# Patient Record
Sex: Female | Born: 1965 | Race: White | Hispanic: No | Marital: Married | Smoking: Never smoker
Health system: Southern US, Community
[De-identification: ages and names within clinical notes are randomized; demographics above are authoritative.]

## PROBLEM LIST (undated history)

## (undated) DIAGNOSIS — T8859XA Other complications of anesthesia, initial encounter: Secondary | ICD-10-CM

## (undated) DIAGNOSIS — I10 Essential (primary) hypertension: Secondary | ICD-10-CM

## (undated) DIAGNOSIS — K219 Gastro-esophageal reflux disease without esophagitis: Secondary | ICD-10-CM

## (undated) DIAGNOSIS — T4145XA Adverse effect of unspecified anesthetic, initial encounter: Secondary | ICD-10-CM

## (undated) DIAGNOSIS — R7303 Prediabetes: Secondary | ICD-10-CM

## (undated) DIAGNOSIS — J45909 Unspecified asthma, uncomplicated: Secondary | ICD-10-CM

## (undated) DIAGNOSIS — J189 Pneumonia, unspecified organism: Secondary | ICD-10-CM

## (undated) DIAGNOSIS — M1990 Unspecified osteoarthritis, unspecified site: Secondary | ICD-10-CM

---

## 1993-06-28 HISTORY — PX: ABDOMINAL HYSTERECTOMY: SHX81

## 1998-06-28 HISTORY — PX: LYMPH NODE BIOPSY: SHX201

## 2017-08-10 ENCOUNTER — Other Ambulatory Visit (HOSPITAL_COMMUNITY): Payer: Self-pay | Admitting: General Surgery

## 2017-08-24 ENCOUNTER — Other Ambulatory Visit (HOSPITAL_COMMUNITY): Payer: Managed Care, Other (non HMO)

## 2017-08-24 ENCOUNTER — Ambulatory Visit (HOSPITAL_COMMUNITY): Payer: Managed Care, Other (non HMO)

## 2017-08-29 ENCOUNTER — Ambulatory Visit (HOSPITAL_COMMUNITY)
Admission: RE | Admit: 2017-08-29 | Discharge: 2017-08-29 | Disposition: A | Discharge status: Home / Self Care | Payer: Managed Care, Other (non HMO) | Source: Ambulatory Visit | Attending: General Surgery | Admitting: General Surgery

## 2017-08-29 ENCOUNTER — Other Ambulatory Visit: Payer: Self-pay

## 2017-08-29 ENCOUNTER — Encounter: Payer: Self-pay | Admitting: Skilled Nursing Facility1

## 2017-08-29 ENCOUNTER — Encounter: Payer: Managed Care, Other (non HMO) | Attending: General Surgery | Admitting: Skilled Nursing Facility1

## 2017-08-29 DIAGNOSIS — R9431 Abnormal electrocardiogram [ECG] [EKG]: Secondary | ICD-10-CM | POA: Diagnosis not present

## 2017-08-29 DIAGNOSIS — R932 Abnormal findings on diagnostic imaging of liver and biliary tract: Secondary | ICD-10-CM | POA: Diagnosis not present

## 2017-08-29 DIAGNOSIS — Z713 Dietary counseling and surveillance: Secondary | ICD-10-CM | POA: Diagnosis not present

## 2017-08-29 DIAGNOSIS — Z01810 Encounter for preprocedural cardiovascular examination: Secondary | ICD-10-CM | POA: Insufficient documentation

## 2017-08-29 DIAGNOSIS — E669 Obesity, unspecified: Secondary | ICD-10-CM | POA: Diagnosis present

## 2017-08-29 DIAGNOSIS — K449 Diaphragmatic hernia without obstruction or gangrene: Secondary | ICD-10-CM | POA: Insufficient documentation

## 2017-08-29 NOTE — Progress Notes (Signed)
Pre-Op Assessment Visit:  Pre-Operative RYGB Surgery  Medical Nutrition Therapy:  Appt start time: 1:57  End time:  2:57  Patient was seen on 08/29/2017 for Pre-Operative Nutrition Assessment. Assessment and [facility] on 08/29/2017.   Pt expectation of surgery: to lose 20 pounds in 1 month.   Pt states she feels apples make her hungry.   Start weight at [facility]: 273.3 BMI: 44.11  24 hr Dietary Recall: First Meal: 4 am: protein shakes or eggs---skipped usually Snack:  Second Meal: grilled chicken and broccoli and brussle sprouts squash zucchini Snack: yogurt covered pretzels  Third Meal: eating out: vegetables and protein Snack: dessert  Beverages: water, hot tea, sweet tea, coffee with creamer  Encouraged to engage in 150 minutes of moderate physical activity including cardiovascular and weight baring weekly  Handouts given during visit include:  . Pre-Op Goals . Bariatric Surgery Protein Shakes During the appointment today the following Pre-Op Goals were reviewed with the patient: . Maintain or lose weight as instructed by your surgeon . Make healthy food choices . Begin to limit portion sizes . Limited concentrated sugars and fried foods . Keep fat/sugar in the single digits per serving on             food labels . Practice CHEWING your food  (aim for 30 chews per bite or until applesauce consistency) . Practice not drinking 15 minutes before, during, and 30 minutes after each meal/snack . Avoid all carbonated beverages  . Avoid/limit caffeinated beverages  . Avoid all sugar-sweetened beverages . Consume 3 meals per day; eat every 3-5 hours . Make a list of non-food related activities . Aim for 64-100 ounces of FLUID daily  . Aim for at least 60-80 grams of PROTEIN daily . Look for a liquid protein source that contain ?15 g protein and ?5 g carbohydrate  (ex: shakes, drinks, shots)  -Follow  diet recommendations listed below   Energy and Macronutrient Recomendations: Calories: 1500 Carbohydrate: 170 Protein: 112 Fat: 42  Demonstrated degree of understanding via:  Teach Back  Teaching Method Utilized:  Visual Auditory Hands on  Barriers to learning/adherence to lifestyle change: none identified   Patient to call the [facility] to enroll in Pre-Op and Post-Op Nutrition Education when surgery date is scheduled.

## 2017-10-17 ENCOUNTER — Ambulatory Visit: Payer: Self-pay | Admitting: Registered"

## 2017-10-18 ENCOUNTER — Ambulatory Visit (INDEPENDENT_AMBULATORY_CARE_PROVIDER_SITE_OTHER): Payer: 59 | Admitting: Psychiatry

## 2017-10-18 DIAGNOSIS — F509 Eating disorder, unspecified: Secondary | ICD-10-CM

## 2017-10-24 ENCOUNTER — Encounter: Payer: Managed Care, Other (non HMO) | Attending: General Surgery | Admitting: Skilled Nursing Facility1

## 2017-10-24 DIAGNOSIS — E669 Obesity, unspecified: Secondary | ICD-10-CM | POA: Insufficient documentation

## 2017-10-24 DIAGNOSIS — Z713 Dietary counseling and surveillance: Secondary | ICD-10-CM | POA: Insufficient documentation

## 2017-10-26 ENCOUNTER — Encounter: Payer: Self-pay | Admitting: Skilled Nursing Facility1

## 2017-10-26 NOTE — Progress Notes (Signed)
Pre-Operative Nutrition Class:  Appt start time: 1443   End time:  1830.  Patient was seen on 10/24/2017 for Pre-Operative Bariatric Surgery Education at [facility].   Surgery date:  Surgery type: RYGB Start weight at [facility]: 273.3 Weight today: 276.5  Samples given per MNT protocol. Patient educated on appropriate usage: Procare Multivitamin Lot #  [id number] Exp: 12/2018  Renee Pain Protein  Shake Lot # [id number] Exp: 07/march/2020  The following the learning objectives were met by the patient during this course:  Identify Pre-Op Dietary Goals and will begin 2 weeks pre-operatively  Identify appropriate sources of fluids and proteins   State protein recommendations and appropriate sources pre and post-operatively  Identify Post-Operative Dietary Goals and will follow for 2 weeks post-operatively  Identify appropriate multivitamin and calcium sources  Describe the need for physical activity post-operatively and will follow MD recommendations  State when to call healthcare provider regarding medication questions or post-operative complications  Handouts given during class include:  Pre-Op Bariatric Surgery Diet Handout  Protein Shake Handout  Post-Op Bariatric Surgery Nutrition Handout  BELT Program Information Flyer  Support Group Information Flyer  [facility] Bariatric Supplements Price List  Follow-Up Plan: Patient will follow-up at [facility] 2 weeks post operatively for diet advancement per MD.

## 2017-11-01 ENCOUNTER — Ambulatory Visit (INDEPENDENT_AMBULATORY_CARE_PROVIDER_SITE_OTHER): Payer: 59 | Admitting: Psychiatry

## 2017-11-01 DIAGNOSIS — F509 Eating disorder, unspecified: Secondary | ICD-10-CM | POA: Diagnosis not present

## 2017-12-02 ENCOUNTER — Ambulatory Visit: Payer: Self-pay | Admitting: General Surgery

## 2017-12-14 NOTE — Patient Instructions (Addendum)
Leonette Tischer  12/14/2017   Your procedure is scheduled on: 12-19-17  Report to [facility] Main  Entrance  Report to admitting at 1130 AM    Call this number if you have problems the morning of surgery [phone]   Remember: Do not eat food after 600 pm night before surgery.MORNING OF SURGERY DRINK:  1SHAKE BEFORE YOU LEAVE HOME, DRINK ALL OF THE SHAKE AT ONE TIME.   NO SOLID FOOD AFTER 600 PM THE NIGHT BEFORE YOUR SURGERY. YOU MAY DRINK CLEAR FLUIDS. DRINK 2 SHAKES AT 1000 PM NIGHT BEFORE SURGERY.  THE SHAKE YOU DRINK BEFORE YOU LEAVE HOME WILL BE THE LAST FLUIDS YOU DRINK BEFORE SURGERY.DRINK LAST SHAKE AT 1030 AM.  PAIN IS EXPECTED AFTER SURGERY AND WILL NOT BE COMPLETELY ELIMINATED. AMBULATION AND TYLENOL WILL HELP REDUCE INCISIONAL AND GAS PAIN. MOVEMENT IS KEY!  YOU ARE EXPECTED TO BE OUT OF BED WITHIN 4 HOURS OF ADMISSION TO YOUR PATIENT ROOM.  SITTING IN THE RECLINER THROUGHOUT THE DAY IS IMPORTANT FOR DRINKING FLUIDS AND MOVING GAS THROUGHOUT THE GI TRACT.  COMPRESSION STOCKINGS SHOULD BE WORN [employer] STAY UNLESS YOU ARE WALKING.   INCENTIVE SPIROMETER SHOULD BE USED EVERY HOUR WHILE AWAKE TO DECREASE POST-OPERATIVE COMPLICATIONS SUCH AS PNEUMONIA.  WHEN DISCHARGED HOME, IT IS IMPORTANT TO CONTINUE TO WALK EVERY HOUR AND USE THE INCENTIVE SPIROMETER EVERY HOUR.     CLEAR LIQUID DIET   Foods Allowed                                                                     Foods Excluded  Coffee and tea, regular and decaf                             liquids that you cannot  Plain Jell-O in any flavor                                             see through such as: Fruit ices (not with fruit pulp)                                     milk, soups, orange juice  Iced Popsicles                                    All solid food Carbonated beverages, regular and diet                                    Cranberry, grape and apple juices Sports  drinks like Gatorade Lightly seasoned clear broth or consume(fat free) Sugar, honey syrup  Sample Menu Breakfast  Lunch                                     Supper Cranberry juice                    Beef broth                            Chicken broth Jell-O                                     Grape juice                           Apple juice Coffee or tea                        Jell-O                                      Popsicle                                                Coffee or tea                        Coffee or tea  _____________________________________________________________________        .    Take these medicines the morning of surgery with A SIP OF WATER: FLONASE NASAL SPRAY, NEXIUM              You may not have any metal on your body including hair pins and              piercings  Do not wear jewelry, make-up, lotions, powders or perfumes, deodorant             Do not wear nail polish.  Do not shave  48 hours prior to surgery.              Men may shave face and neck.   Do not bring valuables to the hospital. [place] [facility] IS NOT             RESPONSIBLE   FOR VALUABLES.  Contacts, dentures or bridgework may not be worn into surgery.  Leave suitcase in the car. After surgery it may be brought to your room.     Patients discharged the day of surgery will not be allowed to drive home.  Name and phone number of your driver:  Special Instructions: N/A              Please read over the following fact sheets you were given: _____________________________________________________________________ [facility] - Preparing for Surgery Before surgery, you can play an important role.  Because skin is not sterile, your skin needs to be as free of germs as possible.  You can reduce the number of germs on your skin by washing with CHG (chlorahexidine gluconate) soap before surgery.  CHG is an antiseptic cleaner which kills germs and bonds with the skin  to  continue killing germs even after washing. Please DO NOT use if you have an allergy to CHG or antibacterial soaps.  If your skin becomes reddened/irritated stop using the CHG and inform your nurse when you arrive at Short Stay. Do not shave (including legs and underarms) for at least 48 hours prior to the first CHG shower.  You may shave your face/neck. Please follow these instructions carefully:  1.  Shower with CHG Soap the night before surgery and the  morning of Surgery.  2.  If you choose to wash your hair, wash your hair first as usual with your  normal  shampoo.  3.  After you shampoo, rinse your hair and body thoroughly to remove the  shampoo.                           4.  Use CHG as you would any other liquid soap.  You can apply chg directly  to the skin and wash                       Gently with a scrungie or clean washcloth.  5.  Apply the CHG Soap to your body ONLY FROM THE NECK DOWN.   Do not use on face/ open                           Wound or open sores. Avoid contact with eyes, ears mouth and genitals (private parts).                       Wash face,  Genitals (private parts) with your normal soap.             6.  Wash thoroughly, paying special attention to the area where your surgery  will be performed.  7.  Thoroughly rinse your body with warm water from the neck down.  8.  DO NOT shower/wash with your normal soap after using and rinsing off  the CHG Soap.                9.  Pat yourself dry with a clean towel.            10.  Wear clean pajamas.            11.  Place clean sheets on your bed the night of your first shower and do not  sleep with pets. Day of Surgery : Do not apply any lotions/deodorants the morning of surgery.  Please wear clean clothes to the hospital/surgery center.  FAILURE TO FOLLOW THESE INSTRUCTIONS MAY RESULT IN THE CANCELLATION OF YOUR SURGERY PATIENT SIGNATURE_________________________________  NURSE  SIGNATURE__________________________________  ________________________________________________________________________   Adam Phenix  An incentive spirometer is a tool that can help keep your lungs clear and active. This tool measures how well you are filling your lungs with each breath. Taking long deep breaths may help reverse or decrease the chance of developing breathing (pulmonary) problems (especially infection) following:  A long period of time when you are unable to move or be active. BEFORE THE PROCEDURE   If the spirometer includes an indicator to show your best effort, your nurse or respiratory therapist will set it to a desired goal.  If possible, sit up straight or lean slightly forward. Try not to slouch.  Hold the incentive spirometer in an upright position. INSTRUCTIONS FOR USE  1. Sit  on the edge of your bed if possible, or sit up as far as you can in bed or on a chair. 2. Hold the incentive spirometer in an upright position. 3. Breathe out normally. 4. Place the mouthpiece in your mouth and seal your lips tightly around it. 5. Breathe in slowly and as deeply as possible, raising the piston or the ball toward the top of the column. 6. Hold your breath for 3-5 seconds or for as long as possible. Allow the piston or ball to fall to the bottom of the column. 7. Remove the mouthpiece from your mouth and breathe out normally. 8. Rest for a few seconds and repeat Steps 1 through 7 at least 10 times every 1-2 hours when you are awake. Take your time and take a few normal breaths between deep breaths. 9. The spirometer may include an indicator to show your best effort. Use the indicator as a goal to work toward during each repetition. 10. After each set of 10 deep breaths, practice coughing to be sure your lungs are clear. If you have an incision (the cut made at the time of surgery), support your incision when coughing by placing a pillow or rolled up towels firmly  against it. Once you are able to get out of bed, walk around indoors and cough well. You may stop using the incentive spirometer when instructed by your caregiver.  RISKS AND COMPLICATIONS  Take your time so you do not get dizzy or light-headed.  If you are in pain, you may need to take or ask for pain medication before doing incentive spirometry. It is harder to take a deep breath if you are having pain. AFTER USE  Rest and breathe slowly and easily.  It can be helpful to keep track of a log of your progress. Your caregiver can provide you with a simple table to help with this. If you are using the spirometer at home, follow these instructions: SEEK MEDICAL CARE IF:   You are having difficultly using the spirometer.  You have trouble using the spirometer as often as instructed.  Your pain medication is not giving enough relief while using the spirometer.  You develop fever of 100.5 F (38.1 C) or higher. SEEK IMMEDIATE MEDICAL CARE IF:   You cough up bloody sputum that had not been present before.  You develop fever of 102 F (38.9 C) or greater.  You develop worsening pain at or near the incision site. MAKE SURE YOU:   Understand these instructions.  Will watch your condition.  Will get help right away if you are not doing well or get worse. Document Released: 10/25/2006 Document Revised: 09/06/2011 Document Reviewed: 12/26/2006 ExitCare Patient Information 2014 [employer], [place].   ________________________________________________________________________  WHAT IS A BLOOD TRANSFUSION? Blood Transfusion Information  A transfusion is the replacement of blood or some of its parts. Blood is made up of multiple cells which provide different functions.  Red blood cells carry oxygen and are used for blood loss replacement.  White blood cells fight against infection.  Platelets control bleeding.  Plasma helps clot blood.  Other blood products are available for  specialized needs, such as hemophilia or other clotting disorders. BEFORE THE TRANSFUSION  Who gives blood for transfusions?   Healthy volunteers who are fully evaluated to make sure their blood is safe. This is blood bank blood. Transfusion therapy is the safest it has ever been in the practice of medicine. Before blood is taken from a donor, a  complete history is taken to make sure that person has no history of diseases nor engages in risky social behavior (examples are intravenous drug use or sexual activity with multiple partners). The donor's travel history is screened to minimize risk of transmitting infections, such as malaria. The donated blood is tested for signs of infectious diseases, such as HIV and hepatitis. The blood is then tested to be sure it is compatible with you in order to minimize the chance of a transfusion reaction. If you or a relative donates blood, this is often done in anticipation of surgery and is not appropriate for emergency situations. It takes many days to process the donated blood. RISKS AND COMPLICATIONS Although transfusion therapy is very safe and saves many lives, the main dangers of transfusion include:   Getting an infectious disease.  Developing a transfusion reaction. This is an allergic reaction to something in the blood you were given. Every precaution is taken to prevent this. The decision to have a blood transfusion has been considered carefully by your caregiver before blood is given. Blood is not given unless the benefits outweigh the risks. AFTER THE TRANSFUSION  Right after receiving a blood transfusion, you will usually feel much better and more energetic. This is especially true if your red blood cells have gotten low (anemic). The transfusion raises the level of the red blood cells which carry oxygen, and this usually causes an energy increase.  The nurse administering the transfusion will monitor you carefully for complications. HOME CARE  INSTRUCTIONS  No special instructions are needed after a transfusion. You may find your energy is better. Speak with your caregiver about any limitations on activity for underlying diseases you may have. SEEK MEDICAL CARE IF:   Your condition is not improving after your transfusion.  You develop redness or irritation at the intravenous (IV) site. SEEK IMMEDIATE MEDICAL CARE IF:  Any of the following symptoms occur over the next 12 hours:  Shaking chills.  You have a temperature by mouth above 102 F (38.9 C), not controlled by medicine.  Chest, back, or muscle pain.  People around you feel you are not acting correctly or are confused.  Shortness of breath or difficulty breathing.  Dizziness and fainting.  You get a rash or develop hives.  You have a decrease in urine output.  Your urine turns a dark color or changes to pink, red, or brown. Any of the following symptoms occur over the next 10 days:  You have a temperature by mouth above 102 F (38.9 C), not controlled by medicine.  Shortness of breath.  Weakness after normal activity.  The white part of the eye turns yellow (jaundice).  You have a decrease in the amount of urine or are urinating less often.  Your urine turns a dark color or changes to pink, red, or brown. Document Released: 06/11/2000 Document Revised: 09/06/2011 Document Reviewed: 01/29/2008 [facility] Patient Information 2014 [place], [place].  _______________________________________________________________________

## 2017-12-14 NOTE — Progress Notes (Signed)
EKG AND CHEST XRAY DONE 08-29-17 EPIC

## 2017-12-15 ENCOUNTER — Encounter (HOSPITAL_COMMUNITY)
Admission: RE | Admit: 2017-12-15 | Discharge: 2017-12-15 | Disposition: A | Discharge status: Home / Self Care | Payer: Managed Care, Other (non HMO) | Source: Ambulatory Visit | Attending: General Surgery | Admitting: General Surgery

## 2017-12-15 ENCOUNTER — Encounter (HOSPITAL_COMMUNITY): Payer: Self-pay | Admitting: *Deleted

## 2017-12-15 ENCOUNTER — Other Ambulatory Visit: Payer: Self-pay

## 2017-12-15 DIAGNOSIS — Z01812 Encounter for preprocedural laboratory examination: Secondary | ICD-10-CM | POA: Diagnosis not present

## 2017-12-15 HISTORY — DX: Morbid (severe) obesity due to excess calories: E66.01

## 2017-12-15 HISTORY — DX: Unspecified asthma, uncomplicated: J45.909

## 2017-12-15 HISTORY — DX: Gastro-esophageal reflux disease without esophagitis: K21.9

## 2017-12-15 HISTORY — DX: Prediabetes: R73.03

## 2017-12-15 HISTORY — DX: Unspecified osteoarthritis, unspecified site: M19.90

## 2017-12-15 HISTORY — DX: Adverse effect of unspecified anesthetic, initial encounter: T41.45XA

## 2017-12-15 HISTORY — DX: Essential (primary) hypertension: I10

## 2017-12-15 HISTORY — DX: Pneumonia, unspecified organism: J18.9

## 2017-12-15 HISTORY — DX: Other complications of anesthesia, initial encounter: T88.59XA

## 2017-12-15 LAB — COMPREHENSIVE METABOLIC PANEL
ALT: 20 U/L (ref 14–54)
ANION GAP: 8 (ref 5–15)
AST: 20 U/L (ref 15–41)
Albumin: 3.7 g/dL (ref 3.5–5.0)
Alkaline Phosphatase: 107 U/L (ref 38–126)
BUN: 13 mg/dL (ref 6–20)
CHLORIDE: 104 mmol/L (ref 101–111)
CO2: 29 mmol/L (ref 22–32)
CREATININE: 0.57 mg/dL (ref 0.44–1.00)
Calcium: 9.2 mg/dL (ref 8.9–10.3)
Glucose, Bld: 125 mg/dL — ABNORMAL HIGH (ref 65–99)
POTASSIUM: 3.9 mmol/L (ref 3.5–5.1)
Sodium: 141 mmol/L (ref 135–145)
Total Bilirubin: 0.5 mg/dL (ref 0.3–1.2)
Total Protein: 7.4 g/dL (ref 6.5–8.1)

## 2017-12-15 LAB — CBC WITH DIFFERENTIAL/PLATELET
Basophils Absolute: 0.1 10*3/uL (ref 0.0–0.1)
Basophils Relative: 1 %
EOS PCT: 3 %
Eosinophils Absolute: 0.2 10*3/uL (ref 0.0–0.7)
HCT: 38.4 % (ref 36.0–46.0)
Hemoglobin: 12.2 g/dL (ref 12.0–15.0)
LYMPHS ABS: 3.3 10*3/uL (ref 0.7–4.0)
LYMPHS PCT: 38 %
MCH: 27.1 pg (ref 26.0–34.0)
MCHC: 31.8 g/dL (ref 30.0–36.0)
MCV: 85.3 fL (ref 78.0–100.0)
MONO ABS: 0.5 10*3/uL (ref 0.1–1.0)
Monocytes Relative: 6 %
Neutro Abs: 4.5 10*3/uL (ref 1.7–7.7)
Neutrophils Relative %: 52 %
PLATELETS: 281 10*3/uL (ref 150–400)
RBC: 4.5 MIL/uL (ref 3.87–5.11)
RDW: 14 % (ref 11.5–15.5)
WBC: 8.6 10*3/uL (ref 4.0–10.5)

## 2017-12-15 LAB — HEMOGLOBIN A1C
HEMOGLOBIN A1C: 6.1 % — AB (ref 4.8–5.6)
Mean Plasma Glucose: 128.37 mg/dL

## 2017-12-15 LAB — ABO/RH: ABO/RH(D): B POS

## 2017-12-16 NOTE — H&P (Signed)
History of Present Illness Misty Holloway(Misty Holloway T. Misty Bluestein MD; 11/29/2017 4:54 PM) The patient is a 52 year old female who presents with obesity. She returns for preoperative visit prior to planned laparoscopic Roux-en-Y gastric bypass.. The patient gives a history of progressive obesity since early adulthood despite multiple attempts at medical management. She has been through innumerable efforts at supervised and unsupervised weight loss including medications and has been able to lose up to 70 pounds with the [employer] diet but then has experienced progressive weight regain. Obesity has been affecting the patient in a number of ways including difficulty with routine daily activities and joint pain and fatigue. She has remained relatively free of significant comorbidities although he is being followed for elevated cholesterol. She has a history of obesity and obesity related illness and her family and is very concerned about her long-term health forward at her current weight. The patient has been to our initial information seminar, researched surgical options thoroughly and after initial consultation we elective gastric bypass due to her significant GERD. She has successfully completed her preoperative workup. No concerns also like a nutritional evaluation. Upper GI series showed a small hiatal hernia. Lab work was unremarkable. She has been feeling well with no new illnesses or recent infections or hospitalizations.    Problem List/Past Medical Misty Holloway(Misty Holloway T. 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, MD; 11/29/2017 4:54 PM) Cesarean Section - 1  Hysterectomy (due to cancer) - Complete  Oral Surgery   Diagnostic Studies History Misty Holloway(Misty Holloway T. Misty Madia, MD; 11/29/2017 4:54 PM) Colonoscopy  never Mammogram  within last year Pap Smear  >5 years ago  Allergies Misty Holloway(Misty Holloway, RMA; 11/29/2017 4:35  PM) Morphine Sulfate ER *ANALGESICS - OPIOID*  Allergies Reconciled   Medication History Misty Holloway(Misty Holloway, RMA; 11/29/2017 4:35 PM) HydroCHLOROthiazide (12.5MG  Tablet, Oral) Active. NexIUM (Oral) Specific strength unknown - Active. Fluticasone Propionate (50MCG/ACT Suspension, Nasal) Active. Medications Reconciled  Social History Misty Holloway(Misty Holloway T. Misty Waddell, MD; 11/29/2017 4:54 PM) Alcohol use  Occasional alcohol use. Caffeine use  Coffee, Tea. No drug use  Tobacco use  Never smoker.  Family History Misty Holloway(Misty Holloway [name], MD; 11/29/2017 4:54 PM) Alcohol Abuse  Mother. Arthritis  Father, Mother. Colon Polyps  Mother. Diabetes Mellitus  Father, Mother. Heart Disease  Mother. Heart disease in female family member before age 54  Heart disease in female family member before age 54  Hypertension  Father, Mother. Ischemic Bowel Disease  Mother. Melanoma  Mother.  Pregnancy / Birth History Misty Holloway(Misty Holloway T. Misty Cifelli, MD; 11/29/2017 4:54 PM) Age at menarche  11 years. Age of menopause  3951-55 Gravida  2 Irregular periods  Maternal age  54-30 Para  1  Other Problems Misty Holloway(Misty Holloway T. Misty Ellery, MD; 11/29/2017 4:54 PM) Asthma  Bladder Problems  Diabetes Mellitus  Gastroesophageal Reflux Disease  High blood pressure  Hypercholesterolemia  Oophorectomy  Bilateral.  Vitals Misty Holloway(Misty Holloway RMA; 11/29/2017 4:35 PM) 11/29/2017 4:34 PM Weight: 277.2 lb Height: 66in Body Surface Area: 2.3 m Body Mass Index: 44.74 kg/m  Temp.: 98.72F  Pulse: 104 (Regular)  BP: 130/82 (Sitting, Left Arm, Standard)       Physical Exam Misty Holloway(Misty Holloway T. Misty Bellucci MD; 11/29/2017 4:55 PM) The physical exam findings are as follows: Note:General: Alert, morbidly obese Caucasian female, in no distress Skin: Warm and dry without rash or infection. HEENT: No palpable masses or thyromegaly. Sclera nonicteric. Pupils equal round and reactive. Lungs: Breath sounds clear and equal. No wheezing  or  increased work of breathing. Cardiovascular: Regular rate and rhythm without murmer. No JVD, trace ankle edema. Peripheral pulses intact. No carotid bruits. Abdomen: Nondistended. Soft and nontender. No masses palpable. No organomegaly. No palpable hernias. Extremities: Trace ankle edema, no joint swelling or deformity. No chronic venous stasis changes. Neurologic: Alert and fully oriented. Gait normal. No focal weakness. Psychiatric: Normal mood and affect. Thought content appropriate with normal judgement and insight    Assessment & Plan Misty Holloway T. Misty Guile MD; 11/29/2017 4:56 PM) OBESITY, MORBID, BMI 40.0-49.9 (E66.01) Impression: Patient with progressive morbid obesity unresponsive to multiple efforts at medical management who presents with a BMI of 43.5 and comorbidities of significant GERD and chronic joint pain. I believe there would be very significant medical benefit from surgical weight loss. After our discussion of surgical options currently available the patient has decided to proceed with laparoscopic Roux-en-Y gastric bypass due to her significant reflux could be worsened by the sleeve and hopefully successfully treated with a bypass. We have discussed the nature of the problem and the risks of remaining morbidly obese. We discussed laparoscopic Roux-en-Y gastric bypass in detail including the nature of the procedure, expected hospitalization and recovery, and major risks of anesthetic complications, bleeding, blood clots and pulmonary emboli, leakage and infection and long-term risks of stricture, ulceration, bowel obstruction, nutritional deficiencies, and failure to lose weight or weight regain. We discussed these problems could lead to death. The patient was given a complete consent form to review and all questions were answered. She has successfully completed her preoperative workup as above and ready to proceed with laparoscopic Roux-en-Y gastric bypass.

## 2017-12-18 MED ORDER — BUPIVACAINE LIPOSOME 1.3 % IJ SUSP
20.0000 mL | INTRAMUSCULAR | Status: DC
  Filled 2017-12-18: qty 20

## 2017-12-19 ENCOUNTER — Encounter (HOSPITAL_COMMUNITY): Payer: Self-pay | Admitting: *Deleted

## 2017-12-19 ENCOUNTER — Inpatient Hospital Stay (HOSPITAL_COMMUNITY)
Admission: RE | Admit: 2017-12-19 | Discharge: 2017-12-21 | DRG: 621 | Disposition: A | Discharge status: Home / Self Care | Payer: Managed Care, Other (non HMO) | Attending: General Surgery | Admitting: General Surgery

## 2017-12-19 ENCOUNTER — Inpatient Hospital Stay (HOSPITAL_COMMUNITY): Payer: Managed Care, Other (non HMO) | Admitting: Certified Registered Nurse Anesthetist

## 2017-12-19 ENCOUNTER — Encounter (HOSPITAL_COMMUNITY)
Admission: RE | Disposition: A | Discharge status: Home / Self Care | Payer: Self-pay | Source: Home / Self Care | Attending: General Surgery

## 2017-12-19 DIAGNOSIS — Z79899 Other long term (current) drug therapy: Secondary | ICD-10-CM | POA: Diagnosis not present

## 2017-12-19 DIAGNOSIS — Z90710 Acquired absence of both cervix and uterus: Secondary | ICD-10-CM | POA: Diagnosis not present

## 2017-12-19 DIAGNOSIS — E119 Type 2 diabetes mellitus without complications: Secondary | ICD-10-CM | POA: Diagnosis present

## 2017-12-19 DIAGNOSIS — K219 Gastro-esophageal reflux disease without esophagitis: Secondary | ICD-10-CM | POA: Diagnosis present

## 2017-12-19 DIAGNOSIS — Z885 Allergy status to narcotic agent status: Secondary | ICD-10-CM

## 2017-12-19 DIAGNOSIS — Z6841 Body Mass Index (BMI) 40.0 and over, adult: Secondary | ICD-10-CM | POA: Diagnosis not present

## 2017-12-19 DIAGNOSIS — J45909 Unspecified asthma, uncomplicated: Secondary | ICD-10-CM | POA: Diagnosis present

## 2017-12-19 DIAGNOSIS — I10 Essential (primary) hypertension: Secondary | ICD-10-CM | POA: Diagnosis present

## 2017-12-19 DIAGNOSIS — G8929 Other chronic pain: Secondary | ICD-10-CM | POA: Diagnosis present

## 2017-12-19 HISTORY — PX: GASTRIC ROUX-EN-Y: SHX5262

## 2017-12-19 LAB — HEMOGLOBIN AND HEMATOCRIT, BLOOD
HEMATOCRIT: 38.6 % (ref 36.0–46.0)
Hemoglobin: 12.4 g/dL (ref 12.0–15.0)

## 2017-12-19 LAB — TYPE AND SCREEN
ABO/RH(D): B POS
ANTIBODY SCREEN: NEGATIVE

## 2017-12-19 SURGERY — LAPAROSCOPIC ROUX-EN-Y GASTRIC BYPASS WITH UPPER ENDOSCOPY
Anesthesia: General | Site: Abdomen

## 2017-12-19 MED ORDER — EVICEL 5 ML EX KIT
PACK | Freq: Once | CUTANEOUS | Status: DC
  Filled 2017-12-19: qty 1

## 2017-12-19 MED ORDER — SODIUM CHLORIDE 0.9 % IJ SOLN
INTRAMUSCULAR | Status: DC | PRN
  Administered 2017-12-19: 40 mL via INTRAVENOUS

## 2017-12-19 MED ORDER — LACTATED RINGERS IR SOLN
Status: DC | PRN
  Administered 2017-12-19: 1000 mL

## 2017-12-19 MED ORDER — PROMETHAZINE HCL 25 MG/ML IJ SOLN: 6.2500 mg | INTRAMUSCULAR | Status: DC | PRN

## 2017-12-19 MED ORDER — SODIUM CHLORIDE 0.9 % IJ SOLN
INTRAMUSCULAR | Status: AC
  Filled 2017-12-19: qty 50

## 2017-12-19 MED ORDER — APREPITANT 40 MG PO CAPS
40.0000 mg | ORAL_CAPSULE | ORAL | Status: AC
  Administered 2017-12-19: 40 mg via ORAL
  Filled 2017-12-19: qty 1

## 2017-12-19 MED ORDER — MIDAZOLAM HCL 5 MG/5ML IJ SOLN
INTRAMUSCULAR | Status: DC | PRN
  Administered 2017-12-19: 2 mg via INTRAVENOUS

## 2017-12-19 MED ORDER — KETAMINE HCL 10 MG/ML IJ SOLN
INTRAMUSCULAR | Status: DC | PRN
  Administered 2017-12-19: 30 mg via INTRAVENOUS

## 2017-12-19 MED ORDER — HYDROMORPHONE HCL 1 MG/ML IJ SOLN
0.2500 mg | INTRAMUSCULAR | Status: DC | PRN
  Administered 2017-12-19 (×2): 0.5 mg via INTRAVENOUS

## 2017-12-19 MED ORDER — 0.9 % SODIUM CHLORIDE (POUR BTL) OPTIME
TOPICAL | Status: DC | PRN
  Administered 2017-12-19: 1000 mL

## 2017-12-19 MED ORDER — BUPIVACAINE-EPINEPHRINE (PF) 0.25% -1:200000 IJ SOLN
INTRAMUSCULAR | Status: AC
  Filled 2017-12-19: qty 30

## 2017-12-19 MED ORDER — ONDANSETRON HCL 4 MG/2ML IJ SOLN: 4.0000 mg | INTRAMUSCULAR | Status: DC | PRN

## 2017-12-19 MED ORDER — LIDOCAINE 20MG/ML (2%) 15 ML SYRINGE OPTIME: INTRAMUSCULAR | Status: DC | PRN

## 2017-12-19 MED ORDER — HEPARIN SODIUM (PORCINE) 5000 UNIT/ML IJ SOLN
5000.0000 [IU] | INTRAMUSCULAR | Status: AC
  Administered 2017-12-19: 5000 [IU] via SUBCUTANEOUS
  Filled 2017-12-19: qty 1

## 2017-12-19 MED ORDER — LACTATED RINGERS IV SOLN
INTRAVENOUS | Status: DC
  Administered 2017-12-19 (×2): via INTRAVENOUS

## 2017-12-19 MED ORDER — HYDROMORPHONE HCL 1 MG/ML IJ SOLN
INTRAMUSCULAR | Status: AC
  Filled 2017-12-19: qty 1

## 2017-12-19 MED ORDER — OXYCODONE HCL 5 MG/5ML PO SOLN: 5.0000 mg | ORAL | Status: DC | PRN

## 2017-12-19 MED ORDER — FENTANYL CITRATE (PF) 100 MCG/2ML IJ SOLN: 12.5000 ug | INTRAMUSCULAR | Status: DC | PRN

## 2017-12-19 MED ORDER — DEXAMETHASONE SODIUM PHOSPHATE 4 MG/ML IJ SOLN
4.0000 mg | INTRAMUSCULAR | Status: AC
  Administered 2017-12-19: 10 mg via INTRAVENOUS

## 2017-12-19 MED ORDER — PROPOFOL 10 MG/ML IV BOLUS
INTRAVENOUS | Status: DC | PRN
  Administered 2017-12-19: 200 mg via INTRAVENOUS
  Administered 2017-12-19: 60 mg via INTRAVENOUS

## 2017-12-19 MED ORDER — SUCCINYLCHOLINE CHLORIDE 200 MG/10ML IV SOSY
PREFILLED_SYRINGE | INTRAVENOUS | Status: DC | PRN
  Administered 2017-12-19: 120 mg via INTRAVENOUS

## 2017-12-19 MED ORDER — ROCURONIUM BROMIDE 10 MG/ML (PF) SYRINGE
PREFILLED_SYRINGE | INTRAVENOUS | Status: DC | PRN
Start: 2017-12-19 — End: 2017-12-19
  Administered 2017-12-19: 10 mg via INTRAVENOUS
  Administered 2017-12-19: 70 mg via INTRAVENOUS
  Administered 2017-12-19: 10 mg via INTRAVENOUS

## 2017-12-19 MED ORDER — SUGAMMADEX SODIUM 200 MG/2ML IV SOLN
INTRAVENOUS | Status: DC | PRN
  Administered 2017-12-19: 300 mg via INTRAVENOUS

## 2017-12-19 MED ORDER — FLUTICASONE PROPIONATE 50 MCG/ACT NA SUSP
2.0000 | Freq: Every day | NASAL | Status: DC
  Administered 2017-12-20: 2 via NASAL
  Filled 2017-12-19: qty 16

## 2017-12-19 MED ORDER — PHENYLEPHRINE HCL 10 MG/ML IJ SOLN
INTRAMUSCULAR | Status: DC | PRN
  Administered 2017-12-19: 25 ug/min via INTRAVENOUS

## 2017-12-19 MED ORDER — BUPIVACAINE LIPOSOME 1.3 % IJ SUSP
INTRAMUSCULAR | Status: DC | PRN
  Administered 2017-12-19: 20 mL

## 2017-12-19 MED ORDER — BUPIVACAINE-EPINEPHRINE 0.25% -1:200000 IJ SOLN
INTRAMUSCULAR | Status: DC | PRN
  Administered 2017-12-19: 30 mL

## 2017-12-19 MED ORDER — SODIUM CHLORIDE 0.9 % IJ SOLN
INTRAMUSCULAR | Status: AC
  Filled 2017-12-19: qty 20

## 2017-12-19 MED ORDER — FAMOTIDINE IN NACL 20-0.9 MG/50ML-% IV SOLN
20.0000 mg | Freq: Two times a day (BID) | INTRAVENOUS | Status: DC
  Administered 2017-12-19 – 2017-12-20 (×3): 20 mg via INTRAVENOUS
  Filled 2017-12-19 (×3): qty 50

## 2017-12-19 MED ORDER — GABAPENTIN 300 MG PO CAPS
300.0000 mg | ORAL_CAPSULE | ORAL | Status: AC
  Administered 2017-12-19: 300 mg via ORAL
  Filled 2017-12-19: qty 1

## 2017-12-19 MED ORDER — FENTANYL CITRATE (PF) 100 MCG/2ML IJ SOLN
INTRAMUSCULAR | Status: DC | PRN
  Administered 2017-12-19 (×3): 50 ug via INTRAVENOUS
  Administered 2017-12-19: 100 ug via INTRAVENOUS
  Administered 2017-12-19: 50 ug via INTRAVENOUS

## 2017-12-19 MED ORDER — ENOXAPARIN SODIUM 30 MG/0.3ML ~~LOC~~ SOLN
30.0000 mg | Freq: Two times a day (BID) | SUBCUTANEOUS | Status: DC
Start: 2017-12-20 — End: 2017-12-21
  Administered 2017-12-20 – 2017-12-21 (×3): 30 mg via SUBCUTANEOUS
  Filled 2017-12-19 (×3): qty 0.3

## 2017-12-19 MED ORDER — ACETAMINOPHEN 160 MG/5ML PO SOLN
650.0000 mg | Freq: Four times a day (QID) | ORAL | Status: DC
  Administered 2017-12-20 – 2017-12-21 (×5): 650 mg via ORAL
  Filled 2017-12-19 (×5): qty 20.3

## 2017-12-19 MED ORDER — CHLORHEXIDINE GLUCONATE CLOTH 2 % EX PADS: 6.0000 | MEDICATED_PAD | Freq: Once | CUTANEOUS | Status: DC

## 2017-12-19 MED ORDER — POTASSIUM CHLORIDE IN NACL 20-0.9 MEQ/L-% IV SOLN
INTRAVENOUS | Status: DC
  Administered 2017-12-19: 1000 mL via INTRAVENOUS
  Administered 2017-12-20 – 2017-12-21 (×2): via INTRAVENOUS
  Filled 2017-12-19 (×4): qty 1000

## 2017-12-19 MED ORDER — EVICEL 5 ML EX KIT
PACK | CUTANEOUS | Status: DC | PRN
  Administered 2017-12-19: 1

## 2017-12-19 MED ORDER — PREMIER PROTEIN SHAKE
2.0000 [oz_av] | ORAL | Status: DC
  Administered 2017-12-20 – 2017-12-21 (×7): 2 [oz_av] via ORAL

## 2017-12-19 MED ORDER — LIDOCAINE 2% (20 MG/ML) 5 ML SYRINGE
INTRAMUSCULAR | Status: DC | PRN
  Administered 2017-12-19: 80 mg via INTRAVENOUS

## 2017-12-19 MED ORDER — SODIUM CHLORIDE 0.9 % IV SOLN
2.0000 g | INTRAVENOUS | Status: AC
  Administered 2017-12-19: 2 g via INTRAVENOUS
  Filled 2017-12-19: qty 2

## 2017-12-19 MED ORDER — CELECOXIB 200 MG PO CAPS
400.0000 mg | ORAL_CAPSULE | ORAL | Status: AC
  Administered 2017-12-19: 400 mg via ORAL
  Filled 2017-12-19: qty 2

## 2017-12-19 MED ORDER — LIDOCAINE 20MG/ML (2%) 15 ML SYRINGE OPTIME
INTRAMUSCULAR | Status: DC | PRN
  Administered 2017-12-19: 1 mg/kg/h via INTRAVENOUS

## 2017-12-19 MED ORDER — ACETAMINOPHEN 500 MG PO TABS
1000.0000 mg | ORAL_TABLET | ORAL | Status: AC
  Administered 2017-12-19: 1000 mg via ORAL
  Filled 2017-12-19: qty 2

## 2017-12-19 MED ORDER — SCOPOLAMINE 1 MG/3DAYS TD PT72
1.0000 | MEDICATED_PATCH | TRANSDERMAL | Status: DC
  Administered 2017-12-19: 1.5 mg via TRANSDERMAL
  Filled 2017-12-19: qty 1

## 2017-12-19 SURGICAL SUPPLY — 75 items
APPLICATOR COTTON TIP 6IN STRL (MISCELLANEOUS) IMPLANT
APPLIER CLIP ROT 10 11.4 M/L (STAPLE)
APPLIER CLIP ROT 13.4 12 LRG (CLIP)
BLADE SURG SZ11 CARB STEEL (BLADE) ×3 IMPLANT
CABLE HIGH FREQUENCY MONO STRZ (ELECTRODE) ×3 IMPLANT
CHLORAPREP W/TINT 26ML (MISCELLANEOUS) ×6 IMPLANT
CLIP APPLIE ROT 10 11.4 M/L (STAPLE) IMPLANT
CLIP APPLIE ROT 13.4 12 LRG (CLIP) IMPLANT
CLIP SUT LAPRA TY ABSORB (SUTURE) ×6 IMPLANT
CUTTER FLEX LINEAR 45M (STAPLE) ×3 IMPLANT
DECANTER SPIKE VIAL GLASS SM (MISCELLANEOUS) ×6 IMPLANT
DERMABOND ADVANCED (GAUZE/BANDAGES/DRESSINGS) ×2
DERMABOND ADVANCED .7 DNX12 (GAUZE/BANDAGES/DRESSINGS) ×1 IMPLANT
DEVICE SUT QUICK LOAD TK 5 (STAPLE) IMPLANT
DEVICE SUT TI-KNOT TK 5X26 (MISCELLANEOUS) IMPLANT
DEVICE SUTURE ENDOST 10MM (ENDOMECHANICALS) ×3 IMPLANT
DEVICE TI KNOT TK5 (MISCELLANEOUS)
DISSECTOR BLUNT TIP ENDO 5MM (MISCELLANEOUS) IMPLANT
DRAIN PENROSE 18X1/4 LTX STRL (WOUND CARE) ×3 IMPLANT
ELECT REM PT RETURN 15FT ADLT (MISCELLANEOUS) ×3 IMPLANT
GAUZE 4X4 16PLY RFD (DISPOSABLE) ×3 IMPLANT
GAUZE SPONGE 4X4 12PLY STRL (GAUZE/BANDAGES/DRESSINGS) IMPLANT
GLOVE BIOGEL PI IND STRL 7.5 (GLOVE) ×1 IMPLANT
GLOVE BIOGEL PI INDICATOR 7.5 (GLOVE) ×2
GLOVE ECLIPSE 7.5 STRL STRAW (GLOVE) ×3 IMPLANT
GOWN STRL REUS W/TWL XL LVL3 (GOWN DISPOSABLE) ×12 IMPLANT
HEMOSTAT SURGICEL 4X8 (HEMOSTASIS) IMPLANT
HOVERMATT SINGLE USE (MISCELLANEOUS) ×3 IMPLANT
KIT BASIN OR (CUSTOM PROCEDURE TRAY) ×3 IMPLANT
KIT GASTRIC LAVAGE 34FR ADT (SET/KITS/TRAYS/PACK) ×3 IMPLANT
LUBRICANT JELLY K Y 4OZ (MISCELLANEOUS) IMPLANT
MARKER SKIN DUAL TIP RULER LAB (MISCELLANEOUS) ×3 IMPLANT
NEEDLE SPNL 22GX3.5 QUINCKE BK (NEEDLE) ×3 IMPLANT
PACK CARDIOVASCULAR III (CUSTOM PROCEDURE TRAY) ×3 IMPLANT
POUCH SPECIMEN RETRIEVAL 10MM (ENDOMECHANICALS) IMPLANT
QUICK LOAD TK 5 (STAPLE)
RELOAD 45 VASCULAR/THIN (ENDOMECHANICALS) ×3 IMPLANT
RELOAD ENDO STITCH 2.0 (ENDOMECHANICALS) ×20
RELOAD STAPLE TA45 3.5 REG BLU (ENDOMECHANICALS) ×6 IMPLANT
RELOAD STAPLER BLUE 60MM (STAPLE) ×3 IMPLANT
RELOAD STAPLER GOLD 60MM (STAPLE) ×1 IMPLANT
RELOAD STAPLER WHITE 60MM (STAPLE) ×1 IMPLANT
SCISSORS LAP 5X45 EPIX DISP (ENDOMECHANICALS) ×3 IMPLANT
SET IRRIG TUBING LAPAROSCOPIC (IRRIGATION / IRRIGATOR) ×3 IMPLANT
SHEARS HARMONIC ACE PLUS 45CM (MISCELLANEOUS) ×3 IMPLANT
SLEEVE ADV FIXATION 12X100MM (TROCAR) ×6 IMPLANT
SOLUTION ANTI FOG 6CC (MISCELLANEOUS) ×3 IMPLANT
STAPLER ECHELON LONG 60 440 (INSTRUMENTS) ×3 IMPLANT
STAPLER RELOAD BLUE 60MM (STAPLE) ×9
STAPLER RELOAD GOLD 60MM (STAPLE) ×3
STAPLER RELOAD WHITE 60MM (STAPLE) ×3
SUT MNCRL AB 4-0 PS2 18 (SUTURE) ×3 IMPLANT
SUT RELOAD ENDO STITCH 2 48X1 (ENDOMECHANICALS) ×5
SUT RELOAD ENDO STITCH 2.0 (ENDOMECHANICALS) ×5
SUT SURGIDAC NAB ES-9 0 48 120 (SUTURE) IMPLANT
SUT VIC AB 2-0 SH 27 (SUTURE)
SUT VIC AB 2-0 SH 27X BRD (SUTURE) IMPLANT
SUTURE RELOAD END STTCH 2 48X1 (ENDOMECHANICALS) ×5 IMPLANT
SUTURE RELOAD ENDO STITCH 2.0 (ENDOMECHANICALS) ×5 IMPLANT
SYR 10ML ECCENTRIC (SYRINGE) ×3 IMPLANT
SYR 20CC LL (SYRINGE) ×6 IMPLANT
SYR 50ML LL SCALE MARK (SYRINGE) ×3 IMPLANT
TIP RIGID 35CM EVICEL (HEMOSTASIS) ×3 IMPLANT
TOWEL OR 17X26 10 PK STRL BLUE (TOWEL DISPOSABLE) ×3 IMPLANT
TOWEL OR NON WOVEN STRL DISP B (DISPOSABLE) ×3 IMPLANT
TRAY FOLEY MTR SLVR 16FR STAT (SET/KITS/TRAYS/PACK) IMPLANT
TROCAR ADV FIXATION 12X100MM (TROCAR) ×3 IMPLANT
TROCAR ADV FIXATION 5X100MM (TROCAR) ×3 IMPLANT
TROCAR BLADELESS OPT 5 100 (ENDOMECHANICALS) ×3 IMPLANT
TROCAR XCEL 12X100 BLDLESS (ENDOMECHANICALS) ×3 IMPLANT
TUBE CALIBRATION LAPBAND (TUBING) IMPLANT
TUBING CONNECTING 10 (TUBING) ×4 IMPLANT
TUBING CONNECTING 10' (TUBING) ×2
TUBING ENDO SMARTCAP PENTAX (MISCELLANEOUS) ×3 IMPLANT
TUBING INSUF HEATED (TUBING) ×3 IMPLANT

## 2017-12-19 NOTE — Progress Notes (Signed)
Patient started with 2 oz water . Patient was educated and instructed. We will continue to monitor.

## 2017-12-19 NOTE — Anesthesia Procedure Notes (Signed)
Procedure Name: Intubation Date/Time: 12/19/2017 2:39 PM Performed by: Vanessa Durhamochran, Juletta Berhe Glenn, CRNA Pre-anesthesia Checklist: Patient identified, Emergency Drugs available, Suction available and Patient being monitored Patient Re-evaluated:Patient Re-evaluated prior to induction Oxygen Delivery Method: Circle system utilized Preoxygenation: Pre-oxygenation with 100% oxygen Induction Type: IV induction Ventilation: Mask ventilation without difficulty Laryngoscope Size: 2 and Miller Grade View: Grade I Tube type: Oral Tube size: 7.0 mm Number of attempts: 1 Airway Equipment and Method: Stylet Placement Confirmation: ETT inserted through vocal cords under direct vision,  positive ETCO2 and breath sounds checked- equal and bilateral Secured at: 21 cm Tube secured with: Tape Dental Injury: Teeth and Oropharynx as per pre-operative assessment

## 2017-12-19 NOTE — Anesthesia Preprocedure Evaluation (Addendum)
Anesthesia Evaluation  Patient identified by MRN, date of birth, ID band Patient awake    Reviewed: Allergy & Precautions, NPO status , Patient's Chart, lab work & pertinent test results  Airway Mallampati: II  TM Distance: >3 FB Neck ROM: Full    Dental no notable dental hx.    Pulmonary asthma (mild, controlled) ,    Pulmonary exam normal breath sounds clear to auscultation       Cardiovascular hypertension, Pt. on medications Normal cardiovascular exam Rhythm:Regular Rate:Normal  ECG: NSR, rate 73   Neuro/Psych negative neurological ROS  negative psych ROS   GI/Hepatic Neg liver ROS, GERD  Medicated and Controlled,  Endo/Other  Morbid obesity  Renal/GU negative Renal ROS     Musculoskeletal negative musculoskeletal ROS (+)   Abdominal (+) + obese,   Peds  Hematology negative hematology ROS (+)   Anesthesia Other Findings   Reproductive/Obstetrics                            Anesthesia Physical Anesthesia Plan  ASA: III  Anesthesia Plan: General   Post-op Pain Management:    Induction: Intravenous  PONV Risk Score and Plan: 4 or greater and Scopolamine patch - Pre-op, Midazolam, Dexamethasone, Ondansetron and Treatment may vary due to age or medical condition  Airway Management Planned: Oral ETT  Additional Equipment:   Intra-op Plan:   Post-operative Plan: Extubation in OR  Informed Consent: I have reviewed the patients History and Physical, chart, labs and discussed the procedure including the risks, benefits and alternatives for the proposed anesthesia with the patient or authorized representative who has indicated his/her understanding and acceptance.   Dental advisory given  Plan Discussed with: CRNA  Anesthesia Plan Comments:         Anesthesia Quick Evaluation

## 2017-12-19 NOTE — Transfer of Care (Signed)
Immediate Anesthesia Transfer of Care Note  Patient: Misty Holloway  Procedure(s) Performed: LAPAROSCOPIC ROUX-EN-Y GASTRIC BYPASS WITH UPPER ENDOSCOPY AND ERAS PATHWAY (N/A Abdomen)  Patient Location: PACU  Anesthesia Type:General  Level of Consciousness: drowsy  Airway & Oxygen Therapy: Patient Spontanous Breathing and Patient connected to face mask  Post-op Assessment: Report given to RN and Post -op Vital signs reviewed and stable  Post vital signs: Reviewed and stable  Last Vitals:  Vitals Value Taken Time  BP 151/65 12/19/2017  5:40 PM  Temp    Pulse 86 12/19/2017  5:45 PM  Resp 13 12/19/2017  5:45 PM  SpO2 96 % 12/19/2017  5:45 PM  Vitals shown include unvalidated device data.  Last Pain:  Vitals:   12/19/17 1157  TempSrc: Oral  PainSc: 0-No pain         Complications: No apparent anesthesia complications

## 2017-12-19 NOTE — Op Note (Signed)
Name:  Misty Holloway MRN: [id number] Date of Surgery: 12/19/2017  Preop Diagnosis:  Morbid Obesity, S/P RYGB  Postop Diagnosis:  Morbid Obesity, S/P RYGB (Weight - 277, BMI - 44.73)  Procedure:  Upper endoscopy  (Intraoperative)  Surgeon:  Ovidio Kinavid Ovetta Bazzano, M.D.  Anesthesia:  GET  Indications for procedure: Misty Holloway is a 52 y.o. female whose primary care physician is Champe-Seagle, Melissa, MD and has completed a Roux-en-Y gastric bypass today by Dr. Johna SheriffHoxworth.  I am doing an intraoperative upper endoscopy to evaluate the gastric pouch and the gastro-jejunal anastomosis.  Operative Note: The patient is under general anesthesia.  Dr. Johna SheriffHoxworth is laparoscoping the patient while I do an upper endoscopy to evaluate the stomach pouch and gastrojejunal anastomosis.  With the patient intubated, I passed the Pentax endoscope without difficulty down the esophagus.  The esophago-gastric junction was at 40 cm.  The gastro-jejunal anastomosis was at 44 cm.  The mucosa of the stomach looked viable and the staple line was intact without bleeding.  The gastro-jejunal anastomosis looked okay.  While I insufflated the stomach pouch with air, Dr. Johna SheriffHoxworth clamped off the efferent limb of the jejunum.  He then flooded the upper abdomen with saline to put the gastric pouch and gastro-jejunal anastomosis under saline.  There was no bubbling or evidence of a leak.    The scope was then withdrawn.  The esophagus was unremarkable and the patient tolerated the endoscopy without difficulty.  Ovidio Kinavid Marcas Bowsher, MD, [facility] Pager: [phone] Office phone:  [phone]

## 2017-12-19 NOTE — Discharge Instructions (Signed)
° ° ° °GASTRIC BYPASS/SLEEVE ° Home Care Instructions ° ° These instructions are to help you care for yourself when you go home. ° °Call: If you have any problems. °• Call [phone] and ask for the surgeon on call °• If you need immediate help, come to the ER at [facility].  °• Tell the ER staff that you are a new post-op gastric bypass or gastric sleeve patient °  °Signs and symptoms to report: • Severe vomiting or nausea °o If you cannot keep down clear liquids for longer than 1 day, call your surgeon  °• Abdominal pain that does not get better after taking your pain medication °• Fever over 100.4° F with chills °• Heart beating over 100 beats a minute °• Shortness of breath at rest °• Chest pain °•  Redness, swelling, drainage, or foul odor at incision (surgical) sites °•  If your incisions open or pull apart °• Swelling or pain in calf (lower leg) °• Diarrhea (Loose bowel movements that happen often), frequent watery, uncontrolled bowel movements °• Constipation, (no bowel movements for 3 days) if this happens: Pick one °o Milk of Magnesia, 2 tablespoons by mouth, 3 times a day for 2 days if needed °o Stop taking Milk of Magnesia once you have a bowel movement °o Call your doctor if constipation continues °Or °o Miralax  (instead of Milk of Magnesia) following the label instructions °o Stop taking Miralax once you have a bowel movement °o Call your doctor if constipation continues °• Anything you think is not normal °  °Normal side effects after surgery: • Unable to sleep at night or unable to focus °• Irritability or moody °• Being tearful (crying) or depressed °These are common complaints, possibly related to your anesthesia medications that put you to sleep, stress of surgery, and change in lifestyle.  This usually goes away a few weeks after surgery.  If these feelings continue, call your primary care doctor. °  °Wound Care: You may have surgical glue, steri-strips, or staples over your incisions after  surgery °• Surgical glue:  Looks like a clear film over your incisions and will wear off a little at a time °• Steri-strips: Strips of tape over your incisions. You may notice a yellowish color on the skin under the steri-strips. This is used to make the   steri-strips stick better. Do not pull the steri-strips off - let them fall off °• Staples: Staples may be removed before you leave the hospital °o If you go home with staples, call [facility], [phone] at for an appointment with your surgeon’s nurse to have staples removed 10 days after surgery. °• Showering: You may shower two (2) days after your surgery unless your surgeon tells you differently °o Wash gently around incisions with warm soapy water, rinse well, and gently pat dry  °o No tub baths until staples are removed, steri-strips fall off or glue is gone.  °  °Medications: • Medications should be liquid or crushed if larger than the size of a dime °• Extended release pills (medication that release a little bit at a time through the day) should NOT be crushed or cut. (examples include XL, ER, DR, SR) °• Depending on the size and number of medications you take, you may need to space (take a few throughout the day)/change the time you take your medications so that you do not over-fill your pouch (smaller stomach) °• Make sure you follow-up with your primary care doctor to   make medication changes needed during rapid weight loss and life-style changes °• If you have diabetes, follow up with the doctor that orders your diabetes medication(s) within one week after surgery and check your blood sugar regularly. °• Do not drive while taking prescription pain medication  °• It is ok to take Tylenol by the bottle instructions with your pain medicine or instead of your pain medicine as needed.  DO NOT TAKE NSAIDS (EXAMPLES OF NSAIDS:  IBUPROFREN/ NAPROXEN)  °Diet:                    First 2 Weeks ° You will see the dietician t about two (2) weeks  after your surgery. The dietician will increase the types of foods you can eat if you are handling liquids well: °• If you have severe vomiting or nausea and cannot keep down clear liquids lasting longer than 1 day, call your surgeon @ ([phone]) °Protein Shake °• Drink at least 2 ounces of shake 5-6 times per day °• Each serving of protein shakes (usually 8 - 12 ounces) should have: °o 15 grams of protein  °o And no more than 5 grams of carbohydrate  °• Goal for protein each day: °o Men = 80 grams per day °o Women = 60 grams per day °• Protein powder may be added to fluids such as non-fat milk or Lactaid milk or unsweetened Soy/Almond milk (limit to 35 grams added protein powder per serving) ° °Hydration °• Slowly increase the amount of water and other clear liquids as tolerated (See Acceptable Fluids) °• Slowly increase the amount of protein shake as tolerated  °•  Sip fluids slowly and throughout the day.  Do not use straws. °• May use sugar substitutes in small amounts (no more than 6 - 8 packets per day; i.e. Splenda) ° °Fluid Goal °• The first goal is to drink at least 8 ounces of protein shake/drink per day (or as directed by the nutritionist); some examples of protein shakes are Syntrax Nectar, Adkins Advantage, EAS Edge HP, and Unjury. See handout from pre-op Bariatric Education Class: °o Slowly increase the amount of protein shake you drink as tolerated °o You may find it easier to slowly sip shakes throughout the day °o It is important to get your proteins in first °• Your fluid goal is to drink 64 - 100 ounces of fluid daily °o It may take a few weeks to build up to this °• 32 oz (or more) should be clear liquids  °And  °• 32 oz (or more) should be full liquids (see below for examples) °• Liquids should not contain sugar, caffeine, or carbonation ° °Clear Liquids: °• Water or Sugar-free flavored water (i.e. Fruit H2O, Propel) °• Decaffeinated coffee or tea (sugar-free) °• Crystal Lite, Wyler’s Lite,  Minute Maid Lite °• Sugar-free Jell-O °• Bouillon or broth °• Sugar-free Popsicle:   *Less than 20 calories each; Limit 1 per day ° °Full Liquids: °Protein Shakes/Drinks + 2 choices per day of other full liquids °• Full liquids must be: °o No More Than 15 grams of Carbs per serving  °o No More Than 3 grams of Fat per serving °• Strained low-fat cream soup (except Cream of Potato or Tomato) °• Non-Fat milk °• Fat-free Lactaid Milk °• Unsweetened Soy Or Unsweetened Almond Milk °• Low Sugar yogurt (Dannon Lite & Fit, Greek yogurt; Oikos Triple Zero; Chobani Simply 100; Yoplait 100 calorie Greek - No Fruit on the Bottom) ° °  °Vitamins   and Minerals • Start 1 day after surgery unless otherwise directed by your surgeon °• 2 Chewable Bariatric Specific Multivitamin / Multimineral Supplement with iron (Example: Bariatric Advantage Multi EA) °• Chewable Calcium with Vitamin D-3 °(Example: 3 Chewable Calcium Plus 600 with Vitamin D-3) °o Take 500 mg three (3) times a day for a total of 1500 mg each day °o Do not take all 3 doses of calcium at one time as it may cause constipation, and you can only absorb 500 mg  at a time  °o Do not mix multivitamins containing iron with calcium supplements; take 2 hours apart °• Menstruating women and those with a history of anemia (a blood disease that causes weakness) may need extra iron °o Talk with your doctor to see if you need more iron °• Do not stop taking or change any vitamins or minerals until you talk to your dietitian or surgeon °• Your Dietitian and/or surgeon must approve all vitamin and mineral supplements °  °Activity and Exercise: Limit your physical activity as instructed by your doctor.  It is important to continue walking at home.  During this time, use these guidelines: °• Do not lift anything greater than ten (10) pounds for at least two (2) weeks °• Do not go back to work or drive until your surgeon says you can °• You may have sex when you feel comfortable  °o It is  VERY important for female patients to use a reliable birth control method; fertility often increases after surgery  °o All hormonal birth control will be ineffective for 30 days after surgery due to medications given during surgery a barrier method must be used. °o Do not get pregnant for at least 18 months °• Start exercising as soon as your doctor tells you that you can °o Make sure your doctor approves any physical activity °• Start with a simple walking program °• Walk 5-15 minutes each day, 7 days per week.  °• Slowly increase until you are walking 30-45 minutes per day °Consider joining our BELT program. [phone] or email [email or web address] °  °Special Instructions Things to remember: °• Use your CPAP when sleeping if this applies to you ° °• [facility] has two free Bariatric Surgery Support Groups that meet monthly °o The 3rd Thursday of each month, 6 pm, [facility]  °o The 2nd Friday of each month, 11:45 am in the private dining room in the basement of [place] °• It is very important to keep all follow up appointments with your surgeon, dietitian, primary care physician, and behavioral health practitioner °• Routine follow up schedule with your surgeon include appointments at 2-3 weeks, 6-8 weeks, 6 months, and 1 year at a minimum.  Your surgeon may request to see you more often.   °o After the first year, please follow up with your bariatric surgeon and dietitian at least once a year in order to maintain best weight loss results °[facility]: [phone] °[facility]: [phone] °Bariatric Nurse Coordinator: [phone] °  °   Reviewed and Endorsed  °by  Patient Education Committee, June, 2016 °Edits Approved: Aug, 2018 ° ° ° °

## 2017-12-19 NOTE — Op Note (Signed)
Preop diagnosis: Morbid obesity  Postop diagnosis: Morbid obesity  Body mass index is 44.45 kg/m.  Surgical procedure: Laparoscopic Roux-en-Y gastric bypass  Surgeon: Sharlet SalinaBenjamin T.Williard Keller M.D.  Asst.: Ovidio Kinavid Newman M.D.  Anesthesia: General  Complications:  None  EBL: Minimal  Drains: None  Disposition: PACU in good condition  Description of procedure: Patient is brought to the operating room and general anesthesia induced. She had received preoperative broad-spectrum IV antibiotics and subcutaneous heparin. The abdomen was widely sterilely prepped and draped. Patient timeout was performed and correct patient and procedure confirmed. Access was obtained with a 12 mm Optiview trocar in the left upper quadrant and pneumoperitoneum established without difficulty. Under direct vision 12 mm trocars were placed laterally in the right upper quadrant, right upper quadrant midclavicular line, and to the left and above the umbilicus for the camera port. A 5 mm trocar was placed laterally in the left upper quadrant.  A bilateral T AP block was done under direct vision with dilute Exparel. the omentum was brought into the upper abdomen and the transverse mesocolon elevated and the ligament of Treitz clearly identified. A 40 cm biliopancreatic limb was then carefully measured from the ligament of Treitz. The small intestine was divided at this point with a single firing of the white load linear stapler. A Penrose drain was sutured to the end of the Roux-en-Y limb for later identification. A 100 cm Roux-en-Y limb was then carefully measured. At this point a side-to-side anastomosis was created between the Roux limb and the end of the biliopancreatic limb. This was accomplished with a single firing of the 45 mm white load linear stapler. The common enterotomy was closed with a running 2-0 Vicryl begun at either end of the enterotomy and tied centrally. The mesenteric defect was then closed with running 2-0  silk. The omentum was then divided with the harmonic scalpel up towards the transverse colon to allow mobility of the Roux limb toward the gastric pouch. The patient was then placed in steep reversed Trendelenburg. Through a 5 mm subxiphoid site the [facility] retractor was placed and the left lobe of the liver elevated with excellent exposure of the upper stomach and hiatus. The angle of Hiss was then mobilized with the harmonic scalpel. A 4 cm gastric pouch was then carefully measured along the lesser curve of the stomach. Dissection was carried along the lesser curve at this point with the Harmonic scalpel working carefully back toward the lesser sac at right angles to the lesser curve. The free lesser sac was then entered. After being sure all tubes were removed from the stomach an initial firing of the gold load 60 mm linear stapler was fired at right angles across the lesser curve for about 4 cm. The gastric pouch was further mobilized posteriorly and then the pouch was completed with 2 further firings of the 60 mm blue load linear stapler up through the previously dissected angle of His. It was ensured that the pouch was completely mobilized away from the gastric remnant. This created a nice tubular 4-5 cm gastric pouch. The staple line of the gastric remnant was then oversewn with 2-0 silk for hemostasis. The Roux limb was then brought up in an antecolic fashion with the candycane facing to the patient's left without undue tension. The gastrojejunostomy was created with an initial posterior row of 2-0 Vicryl between the Roux limb and the staple line of the gastric pouch. Enterotomies were then made in the gastric pouch and the Roux limb with  the harmonic scalpel and at approximately 2-2-1/2 cm anastomosis was created with a single firing of the blue load linear stapler. The staple line was inspected and was intact without bleeding. The common enterotomy was then closed with running 2-0 Vicryl begun at either  end and tied centrally. The wall tube was then easily passed through the anastomosis and an outer anterior layer of running 2-0 Vicryl was placed. The Ewald tube was removed. With the outlet of the gastrojejunostomy clamped and under saline irrigation the assistant performed upper endoscopy and with the gastric pouch tensely distended with air there was no evidence of leak. The pouch was desufflated. The Vonita Moss defect was closed with running 2-0 silk. The abdomen was inspected for any evidence of bleeding or bowel injury and everything looked fine. The Nathanson retractor was removed under direct vision after coating the anastomosis with Tisseel tissue sealant. All CO2 was evacuated and trochars removed. Skin incisions were closed with staples. Sponge needle and instrument counts were correct. The patient was taken to the PACU in good condition.     Mariella Saa MD, FACS  12/19/2017, 5:06 PM

## 2017-12-20 ENCOUNTER — Other Ambulatory Visit: Payer: Self-pay

## 2017-12-20 ENCOUNTER — Encounter (HOSPITAL_COMMUNITY): Payer: Self-pay | Admitting: General Surgery

## 2017-12-20 LAB — CBC WITH DIFFERENTIAL/PLATELET
BASOS ABS: 0 10*3/uL (ref 0.0–0.1)
Basophils Relative: 0 %
EOS PCT: 0 %
Eosinophils Absolute: 0 10*3/uL (ref 0.0–0.7)
HEMATOCRIT: 37.8 % (ref 36.0–46.0)
Hemoglobin: 11.9 g/dL — ABNORMAL LOW (ref 12.0–15.0)
Lymphocytes Relative: 11 %
Lymphs Abs: 1.4 10*3/uL (ref 0.7–4.0)
MCH: 26.7 pg (ref 26.0–34.0)
MCHC: 31.5 g/dL (ref 30.0–36.0)
MCV: 84.9 fL (ref 78.0–100.0)
MONO ABS: 0.2 10*3/uL (ref 0.1–1.0)
Monocytes Relative: 1 %
NEUTROS ABS: 11.5 10*3/uL — AB (ref 1.7–7.7)
Neutrophils Relative %: 88 %
Platelets: 281 10*3/uL (ref 150–400)
RBC: 4.45 MIL/uL (ref 3.87–5.11)
RDW: 14 % (ref 11.5–15.5)
WBC: 13 10*3/uL — ABNORMAL HIGH (ref 4.0–10.5)

## 2017-12-20 MED ORDER — OXYCODONE HCL 5 MG/5ML PO SOLN: 5.0000 mg | ORAL | 0 refills | Status: AC | PRN

## 2017-12-20 NOTE — Progress Notes (Signed)
Patient ID: Misty Holloway, female   DOB: [birth year], 52 y.o.   MRN: [id number] 1 Day Post-Op   Subjective: No major complaints.  Just sore.  No nausea.  Tolerating fluids.  Up out of bed independently and ambulatory.  Objective: Vital signs in last 24 hours: Temp:  [97.4 F (36.3 C)-98.7 F (37.1 C)] 97.6 F (36.4 C) (06/25 0616) Pulse Rate:  [85-93] 92 (06/25 0616) Resp:  [13-20] 15 (06/25 0616) BP: (130-159)/(73-110) 153/90 (06/25 0616) SpO2:  [92 %-99 %] 93 % (06/25 0616) Weight:  [124.9 kg (275 lb 6 oz)-125.1 kg (275 lb 14.4 oz)] 125.1 kg (275 lb 14.4 oz) (06/25 0500)    Intake/Output from previous day: 06/24 0701 - 06/25 0700 In: 2114.3 [P.O.:120; I.V.:1842.7; IV Piggyback:151.7] Out: 1920 [Urine:1900; Blood:20] Intake/Output this shift: No intake/output data recorded.  General appearance: alert, cooperative and no distress GI: Very mild epigastric tenderness Incision/Wound: No erythema or drainage  Lab Results:  Recent Labs    12/19/17 2117 12/20/17 0425  WBC  --  13.0*  HGB 12.4 11.9*  HCT 38.6 37.8  PLT  --  281   BMET No results for input(s): NA, K, CL, CO2, GLUCOSE, BUN, CREATININE, CALCIUM in the last 72 hours.   Studies/Results: No results found.  Anti-infectives: Anti-infectives (From admission, onward)   Start     Dose/Rate Route Frequency Ordered Stop   12/19/17 1145  cefoTEtan (CEFOTAN) 2 g in sodium chloride 0.9 % 100 mL IVPB     2 g 200 mL/hr over 30 Minutes Intravenous On call to O.R. 12/19/17 1134 12/19/17 1433      Assessment/Plan: s/p Procedure(s): LAPAROSCOPIC ROUX-EN-Y GASTRIC BYPASS WITH UPPER ENDOSCOPY AND ERAS PATHWAY Doing well postoperatively.  Diet advancing to protein shakes.  Possible home later today if tolerating this well.   LOS: 1 day    Mariella SaaBenjamin T Andreal Vultaggio 12/20/2017

## 2017-12-20 NOTE — Progress Notes (Signed)
Patient alert and oriented, pain is controlled. Patient is tolerating fluids, advanced to protein shake today, patient is tolerating well. Reviewed Gastric Bypass discharge instructions with patient and patient is able to articulate understanding. Provided information on BELT program, Support Group and [facility] outpatient pharmacy. All questions answered, will continue to monitor.   Total fluid intake 665 Call back per dehydration protocol call back Friday

## 2017-12-20 NOTE — Discharge Summary (Addendum)
  Patient ID: Misty Holloway [id number] y.o. 08-26-1965  12/19/2017  Discharge date and time: 12/21/2017   Admitting Physician: Mariella SaaBenjamin T Earland Reish  Discharge Physician: Mariella SaaBenjamin T Hiram Mciver  Admission Diagnoses: MORBID OBESITY  Discharge Diagnoses: same  Operations: Procedure(s): LAPAROSCOPIC ROUX-EN-Y GASTRIC BYPASS WITH UPPER ENDOSCOPY AND ERAS PATHWAY  Admission Condition: good  Discharged Condition: good  Indication for Admission: Patient with progressive morbid obesity unresponsive to multiple efforts at medical management who presents with a BMI of 43.5 and comorbidities of significant GERD and chronic joint pain.  After extensive preoperative workup and discussion detailed elsewhere she is electively admitted for laparoscopic Roux-en-Y gastric bypass for treatment of her morbid obesity.    Hospital Course: on the afternoon of admission the patient underwent an uneventful laparoscopic Roux-en-Y gastric bypass.  Her postoperative course was uncomplicated.  The following day she has mild pain controlled with oral medications.  No nausea and tolerating fluids and protein shakes without difficulty.  CBC shows only expected minimal leukocytosis and vital signs are stable.  Abdomen is soft and nontender.  She is ambulatory.  Discharge was anticipated but she had a couple of episodes of emesis with feeling like Jell-O she tried stuck in her mid chest.  She was therefore observed overnight and the following morning is tolerating a liquid diet well and has had no further episodes.  Abdomen remains nontender.  She is felt ready for discharge at this time.  Disposition: Home  Patient Instructions:  Allergies as of 12/20/2017      Reactions   Morphine Anaphylaxis   Throat closes shut       Medication List    TAKE these medications   esomeprazole 20 MG capsule Commonly known as:  NEXIUM Take 20 mg by mouth daily.   Fish Oil 1200 MG Caps Take 1 capsule by mouth daily.    fluticasone 50 MCG/ACT nasal spray Commonly known as:  FLONASE Place 2 sprays into both nostrils daily.   hydrochlorothiazide 12.5 MG tablet Commonly known as:  HYDRODIURIL Take 12.5 tablets by mouth daily. Notes to patient:  Monitor Blood Pressure Daily and keep a log for primary care physician.  Monitor for symptoms of dehydration.  You may need to make changes to your medications with rapid weight loss.     oxyCODONE 5 MG/5ML solution Commonly known as:  ROXICODONE Take 5-10 mLs (5-10 mg total) by mouth every 4 (four) hours as needed for moderate pain or severe pain.   vitamin B-12 1000 MCG tablet Commonly known as:  CYANOCOBALAMIN Take 1,000 mcg by mouth daily.       Activity: activity as tolerated Diet: bariatric protein shakes Wound Care: none needed  Follow-up:  With Dr. Johna SheriffHoxworth in 3 weeks.  Signed: Mariella SaaBenjamin T Cybele Maule MD, FACS  12/20/2017, 3:01 PM

## 2017-12-20 NOTE — Progress Notes (Signed)
Patient alert and oriented, Post op day 1.  Provided support and encouragement.  Encouraged pulmonary toilet, ambulation and small sips of liquids.  Completed 4 ounces of water/ice.  All questions answered.  Will continue to monitor.

## 2017-12-20 NOTE — Anesthesia Postprocedure Evaluation (Signed)
Anesthesia Post Note  Patient: Misty Holloway  Procedure(s) Performed: LAPAROSCOPIC ROUX-EN-Y GASTRIC BYPASS WITH UPPER ENDOSCOPY AND ERAS PATHWAY (N/A Abdomen)     Patient location during evaluation: PACU Anesthesia Type: General Level of consciousness: awake and alert Pain management: pain level controlled Vital Signs Assessment: post-procedure vital signs reviewed and stable Respiratory status: spontaneous breathing, nonlabored ventilation, respiratory function stable and patient connected to nasal cannula oxygen Cardiovascular status: blood pressure returned to baseline and stable Postop Assessment: no apparent nausea or vomiting Anesthetic complications: no    Last Vitals:  Vitals:   12/19/17 2044 12/19/17 2151  BP: 137/87 (!) 159/85  Pulse: 91 89  Resp: 14 15  Temp: 37.1 C 36.7 C  SpO2: 92% 93%    Last Pain:  Vitals:   12/19/17 2151  TempSrc: Oral  PainSc:                  Catheryn Baconyan P Genice Kimberlin

## 2017-12-20 NOTE — Progress Notes (Signed)
Patient with episode of emesis x3.  Small amount of clear fluid mixed with mucus. Patient  noted it felt like the fluid "stuck" and wouldn't move down.  Discussed with Dr Johna SheriffHoxworth.  Will hold overnight to monitor.

## 2017-12-20 NOTE — Plan of Care (Signed)

## 2017-12-21 LAB — CBC WITH DIFFERENTIAL/PLATELET
Basophils Absolute: 0 10*3/uL (ref 0.0–0.1)
Basophils Relative: 0 %
Eosinophils Absolute: 0 10*3/uL (ref 0.0–0.7)
Eosinophils Relative: 0 %
HCT: 36.5 % (ref 36.0–46.0)
HEMOGLOBIN: 11.4 g/dL — AB (ref 12.0–15.0)
LYMPHS ABS: 3.2 10*3/uL (ref 0.7–4.0)
LYMPHS PCT: 24 %
MCH: 27.1 pg (ref 26.0–34.0)
MCHC: 31.2 g/dL (ref 30.0–36.0)
MCV: 86.9 fL (ref 78.0–100.0)
Monocytes Absolute: 0.8 10*3/uL (ref 0.1–1.0)
Monocytes Relative: 6 %
Neutro Abs: 9.2 10*3/uL — ABNORMAL HIGH (ref 1.7–7.7)
Neutrophils Relative %: 70 %
Platelets: 290 10*3/uL (ref 150–400)
RBC: 4.2 MIL/uL (ref 3.87–5.11)
RDW: 14.7 % (ref 11.5–15.5)
WBC: 13.3 10*3/uL — AB (ref 4.0–10.5)

## 2017-12-21 NOTE — Progress Notes (Signed)
Pt alert, oriented, tolerating liquids. D/C instructions were given, all questions answered. Pt d/cd home with spouse.

## 2017-12-21 NOTE — Progress Notes (Signed)
2 Days Post-Op   Subjective: Had a couple of episodes of vomiting or regurgitation after trying Jell-O yesterday which felt like it stuck in her mid chest.  None since yesterday afternoon with water and protein shakes.  No pain or other complaints this morning.  Objective: Vital signs in last 24 hours: Temp:  [98.2 F (36.8 C)-98.6 F (37 C)] 98.2 F (36.8 C) (06/26 0521) Pulse Rate:  [61-91] 61 (06/26 0521) Resp:  [17-18] 18 (06/26 0521) BP: (144-161)/(75-95) 153/89 (06/26 0521) SpO2:  [90 %-98 %] 97 % (06/26 0521) Weight:  [124.9 kg (275 lb 5.7 oz)] 124.9 kg (275 lb 5.7 oz) (06/26 0620)    Intake/Output from previous day: 06/25 0701 - 06/26 0700 In: 3380 [P.O.:725; I.V.:2423.3; IV Piggyback:231.7] Out: 1600 [Urine:1600] Intake/Output this shift: No intake/output data recorded.  General appearance: alert, cooperative and no distress GI: normal findings: soft, non-tender Incision/Wound: No erythema or drainage  Lab Results:  Recent Labs    12/20/17 0425 12/21/17 0500  WBC 13.0* 13.3*  HGB 11.9* 11.4*  HCT 37.8 36.5  PLT 281 290   BMET No results for input(s): NA, K, CL, CO2, GLUCOSE, BUN, CREATININE, CALCIUM in the last 72 hours.   Studies/Results: No results found.  Anti-infectives: Anti-infectives (From admission, onward)   Start     Dose/Rate Route Frequency Ordered Stop   12/19/17 1145  cefoTEtan (CEFOTAN) 2 g in sodium chloride 0.9 % 100 mL IVPB     2 g 200 mL/hr over 30 Minutes Intravenous On call to O.R. 12/19/17 1134 12/19/17 1433      Assessment/Plan: s/p Procedure(s): LAPAROSCOPIC ROUX-EN-Y GASTRIC BYPASS WITH UPPER ENDOSCOPY AND ERAS PATHWAY Looks well this morning.  Minimal leukocytosis which I do not think is significant.  She has no pain or tenderness.  Plan discharge later today if tolerating fluids well.   LOS: 2 days    Mariella SaaBenjamin T Ellamarie Naeve 6/26/2019Patient ID: Misty Holloway, female   DOB: 05/21/1966, 52 y.o.   MRN: [id number]

## 2017-12-21 NOTE — Progress Notes (Signed)
Patient feels much better this morning tolerating water, protein shake, and bari clear tray.  No episodes of nausea or feeling like things are "stuck".  Discharge information reinforced.  Questions answered

## 2017-12-26 ENCOUNTER — Telehealth (HOSPITAL_COMMUNITY): Payer: Self-pay

## 2017-12-26 NOTE — Telephone Encounter (Signed)
Patient called to discuss post bariatric surgery follow up questions.  See below:   1.  Tell me about your pain and pain management?no pain  2.  Let's talk about fluid intake.  How much total fluid are you taking in?46 ounces  3.  How much protein have you taken in the last 2 days?30 grams of protein  4.  Have you had nausea?  Tell me about when have experienced nausea and what you did to help?no nausea  5.  Has the frequency or color changed with your urine?urinating regularly no problems sometimes appears orange  6.  Tell me what your incisions look like?look good a little itchy  7.  Have you been passing gas? BM?passing gas had bm  8.  If a problem or question were to arise who would you call?  Do you know contact numbers for [facility], [facility], and [facility]?aware of how to contact all services  9.  How has the walking going?walking regularly has been out with husband walking in evenings  10.  How are your vitamins and calcium going?  How are you taking them?taking bariatric advantage twice per day and 3 calcium without issue

## 2018-01-03 ENCOUNTER — Encounter: Payer: Managed Care, Other (non HMO) | Attending: General Surgery | Admitting: Registered"

## 2018-01-03 DIAGNOSIS — Z713 Dietary counseling and surveillance: Secondary | ICD-10-CM | POA: Insufficient documentation

## 2018-01-03 DIAGNOSIS — E669 Obesity, unspecified: Secondary | ICD-10-CM | POA: Diagnosis not present

## 2018-01-04 NOTE — Progress Notes (Signed)
Bariatric Class:  Appt start time: 1530 end time:  1630.  2 Week Post-Operative Nutrition Class  Patient was seen on 01/03/2018 for Post-Operative Nutrition education at [facility].   Surgery date: 12/19/2017 Surgery type: RYGB Start weight at [facility]: 273.3 Weight today: 256.4 Weight change: 16.9  Pt arrives 11 min late. Pt states she is drinking about 40 ounces of fluid a day. Pt states she has added blood pressure medication. Pt states she experiences some lightheadedness and constipation. Pt reports last A1c (6.1).   TANITA  BODY COMP RESULTS  01/03/2018   BMI (kg/m^2) 41.4   Fat Mass (lbs) 134.0   Fat Free Mass (lbs) 122.4   Total Body Water (lbs) 89.8   The following the learning objectives were met by the patient during this course:  Identifies Phase 3A (Soft, High Proteins) Dietary Goals and will begin from 2 weeks post-operatively to 2 months post-operatively  Identifies appropriate sources of fluids and proteins   States protein recommendations and appropriate sources post-operatively  Identifies the need for appropriate texture modifications, mastication, and bite sizes when consuming solids  Identifies appropriate multivitamin and calcium sources post-operatively  Describes the need for physical activity post-operatively and will follow MD recommendations  States when to call healthcare provider regarding medication questions or post-operative complications  Handouts given during class include:  Phase 3A: Soft, High Protein Diet Handout  Follow-Up Plan: Patient will follow-up at [facility] in 6 weeks for 2 month post-op nutrition visit for diet advancement per MD.

## 2018-01-11 ENCOUNTER — Telehealth: Payer: Self-pay | Admitting: Registered"

## 2018-01-11 NOTE — Telephone Encounter (Signed)
RD called pt to verify fluid intake once starting soft, solid proteins 2 week post-bariatric surgery. Pt unavailable. RD left voicemail.    

## 2018-02-15 ENCOUNTER — Encounter: Payer: Self-pay | Admitting: Skilled Nursing Facility1

## 2018-02-15 ENCOUNTER — Encounter: Payer: Managed Care, Other (non HMO) | Attending: General Surgery | Admitting: Skilled Nursing Facility1

## 2018-02-15 DIAGNOSIS — E669 Obesity, unspecified: Secondary | ICD-10-CM | POA: Diagnosis not present

## 2018-02-15 DIAGNOSIS — Z713 Dietary counseling and surveillance: Secondary | ICD-10-CM | POA: Insufficient documentation

## 2018-02-15 NOTE — Patient Instructions (Addendum)
-  Do not eat beef, pork, or chicken  -Start with soft cooked veggies for 2 days and then if no issues try any vegetable cooke any way you like  -Try soy crumbles in the frozen aisle   -Keep working on getting in 64 fluid ounces

## 2018-02-15 NOTE — Progress Notes (Signed)
Follow-up visit:  8 Weeks Post-Operative RYGB Surgery  Primary concerns today: Post-operative Bariatric Surgery Nutrition Management. Pt reports last A1c (6.1).   Pt states she was struggling at first from liquid to solid but states it is getting better. Pt states she gest overwhelmed with too much food on her plate at once. Pt is reporting having some foods feel "stuck" causing emesis. Pt states she has been feeling tired for the last few weeks. Pt states she takes a nexium every day.    Surgery date: 12/19/2017 Surgery type: RYGB [facility]: 273.3 Weight today: pt declined Weight change:   TANITA  BODY COMP RESULTS  01/03/2018   BMI (kg/m^2) 41.4   Fat Mass (lbs) 134.0   Fat Free Mass (lbs) 122.4   Total Body Water (lbs) 89.8    24-hr recall: 2 meals a day B (AM): protein shake  Snk (AM): cheese L (PM): skipped Snk (PM):   D (PM):  fish or soup: broth or protein shake Snk (PM):   Fluid intake: water, diet green tea, vitamin water: 40 oz  Estimated total protein intake: 50+  Medications: see list Supplementation: bari adv and calcium   Using straws: no Drinking while eating: no Having you been chewing well:no Chewing/swallowing difficulties: no Changes in vision: no Changes to mood/headaches: no Hair loss/Cahnges to skin/Changes to nails: no Any difficulty focusing or concentrating: no Sweating: no Dizziness/Lightheaded: no Palpitations: no  Carbonated beverages: no N/V/D/C/GAS: vomiting with meats Abdominal Pain: no Dumping syndrome: no  Recent physical activity:  Walking treadmill 4 times a week: 30 minutes   Progress Towards Goal(s):  In progress.  Handouts given during visit include:  Non starchy veggies + protein   Nutritional Diagnosis:  [place]-3.3 Overweight/obesity related to past poor dietary habits and physical inactivity as evidenced by patient w/ recent RYGB surgery following dietary guidelines for continued weight loss.  Intervention:   Nutrition counseling. Dietitian educated the pt on advancing her diet to include non starchy veggies. Goals: -Do not eat beef, pork, or chicken -Start with soft cooked veggies for 2 days and then if no issues try any vegetable cooke any way you like -Try soy crumbles in the frozen aisle  -Keep working on getting in 64 fluid ounces   Teaching Method Utilized:  Visual Auditory Hands on  Barriers to learning/adherence to lifestyle change: functional swallowing of meats  Demonstrated degree of understanding via:  Teach Back   Monitoring/Evaluation:  Dietary intake, exercise, and body weight.

## 2018-04-18 ENCOUNTER — Encounter: Payer: Self-pay | Admitting: Skilled Nursing Facility1

## 2018-04-18 ENCOUNTER — Encounter: Payer: Managed Care, Other (non HMO) | Attending: General Surgery | Admitting: Skilled Nursing Facility1

## 2018-04-18 DIAGNOSIS — E669 Obesity, unspecified: Secondary | ICD-10-CM | POA: Insufficient documentation

## 2018-04-18 DIAGNOSIS — Z713 Dietary counseling and surveillance: Secondary | ICD-10-CM | POA: Diagnosis not present

## 2018-04-18 NOTE — Progress Notes (Signed)
Follow-up visit: Post-Operative RYGB Surgery  Primary concerns today: Post-operative Bariatric Surgery Nutrition Management. Pt reports last A1c (6.1).   Pt states swallowing is getting better with stopping the animal proteins and working on incorporating them back in. Pt states she can tolerate steak but struggles with chicken. Pt states she has used an insta pot which has helped a lot. Pt states sometimes she can tell in the morning if she is going tot struggle throughout the day. Pt states she is doing better with meal prep and trying to get on a schedule of eating every 2-3 hours.  Pt states if she eats every 2 hours she feels bloated. Pt states when she eats she loses weight. Pt states she does well with most vegetables except for iceburg lettuce, cabbage, and kale. Pt states she does not experience hunger. Pt states she takes the acid reflux medicine daily. Pt states she struggles with eating out more often than eating at home. Pt states she is experiencing low energy and did have a bladder infection. Pt states in the past week she has vomited about 8 times usually most on the same days due to items getting stuck. Pt states due to feeling lightheaded she is recognizing she needs to eat despite not feeling hunger or appetite. Pt states she likes the soy crumbles.    Surgery date: 12/19/2017 Surgery type: RYGB Start weight at [facility]: 273.3 Weight today: pt declined Weight change:   TANITA  BODY COMP RESULTS  01/03/2018 04/18/2018   BMI (kg/m^2) 41.4 34.7   Fat Mass (lbs) 134.0 98.4   Fat Free Mass (lbs) 122.4 116.6   Total Body Water (lbs) 89.8 84    24-hr recall: 3 meals a day B (7:30AM): tunafish with water chestnuts and diced onion and lite mayo and pepper Snk (AM): edamame or red peppers or cottage cheese or greek yogurt L (PM): salad or [place] chili Snk (PM):   D (PM):  Pinto beans and green beans or tender pork chops Snk (PM):   Fluid intake: decaf coffee, hot tea, water, diet  green tea, vitamin water: averageing 40 oz  Estimated total protein intake: 50+  Medications: see list Supplementation: bari adv and calcium   Using straws: no Drinking while eating: no Having you been chewing well: yes Chewing/swallowing difficulties: no Changes in vision: no Changes to mood/headaches: no Hair loss/Cahnges to skin/Changes to nails: no Any difficulty focusing or concentrating: no Sweating: no Dizziness/Lightheaded: no Palpitations: no  Carbonated beverages: no N/V/D/C/GAS: emesis  Abdominal Pain: no Dumping syndrome: no  Recent physical activity:  Walking treadmill 2-3 times a week: 30 minutes   Progress Towards Goal(s):  In progress.   Nutritional Diagnosis:  [place]-3.3 Overweight/obesity related to past poor dietary habits and physical inactivity as evidenced by patient w/ recent RYGB surgery following dietary guidelines for continued weight loss.  Intervention:  Nutrition counseling. Dietitian educated the pt on advancing her diet to include non starchy veggies. Goals: -Avoid all the foods that cause you to throw up -Continue to try to get in 64 fluid ounces a day -Aim to eat every 3 hours hours instead of 2 hours  Teaching Method Utilized:  Visual Auditory Hands on  Barriers to learning/adherence to lifestyle change: functional swallowing of some foods  Demonstrated degree of understanding via:  Teach Back   Monitoring/Evaluation:  Dietary intake, exercise, and body weight.

## 2018-06-15 ENCOUNTER — Ambulatory Visit: Payer: Self-pay | Admitting: Skilled Nursing Facility1

## 2019-06-25 ENCOUNTER — Encounter (HOSPITAL_COMMUNITY): Payer: Self-pay

## 2019-09-27 IMAGING — RF DG UGI W/ KUB
11 of 16 series · 14 of 24 positions shown · non-contrast
Comparison: None.

CLINICAL DATA: Preop gastric bypass

EXAM:
UPPER GI SERIES WITH KUB
TECHNIQUE: After obtaining a scout radiograph a routine upper GI series was
performed using thin barium
FLUOROSCOPY TIME:  Fluoroscopy Time:  1 min 42 sec
Radiation Exposure Index (if provided by the fluoroscopic device):
65.6 mGy
Number of Acquired Spot Images: 0

[Series 1: t abdomen supine · 0.15mm/px · 1 of 1 slices shown]
[im 1/1]
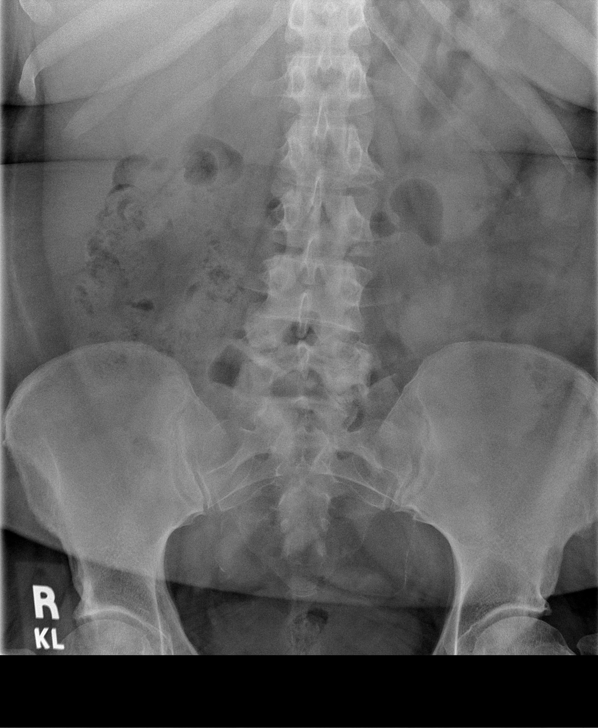

[Series 3: cp_standard · 0.36mm/px · 1 of 170 frames shown (1 of 10)]
[frame 26/170]
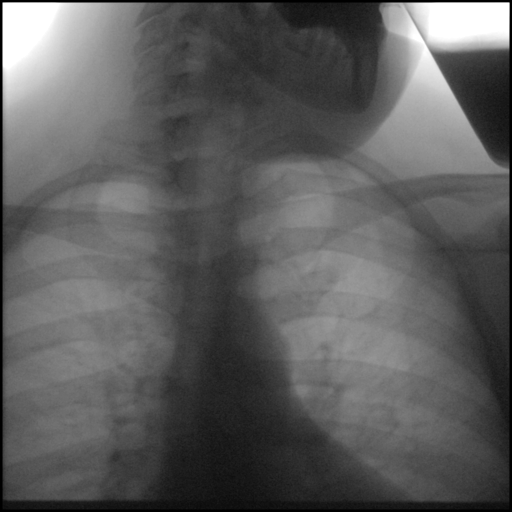

[Series 4: cp_standard · 0.36mm/px · 2 of 105 frames shown (2 of 10)]
[frame 16/105]
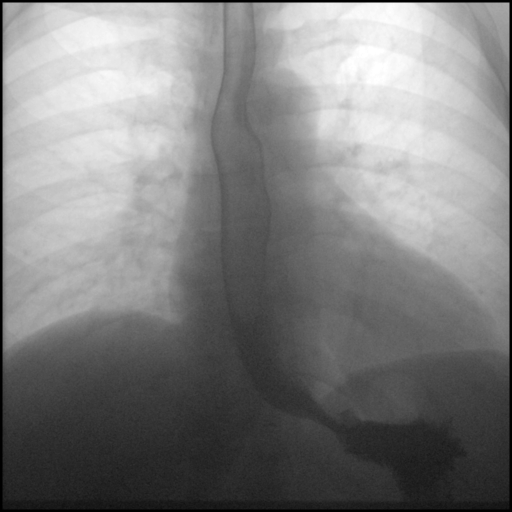
[frame 90/105]
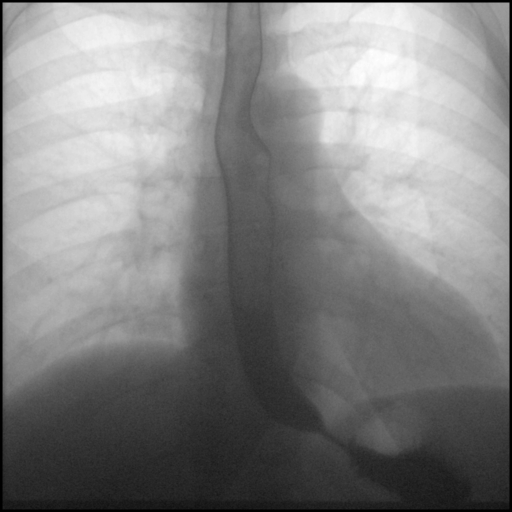

[Series 5: cp_standard · 0.36mm/px · 2 of 152 frames shown (3 of 10)]
[frame 23/152]
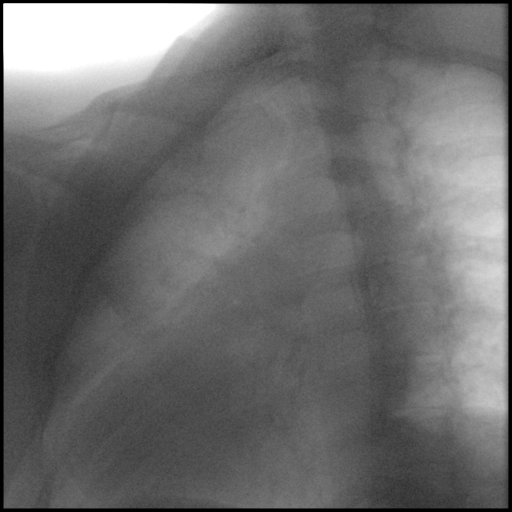
[frame 152/152]
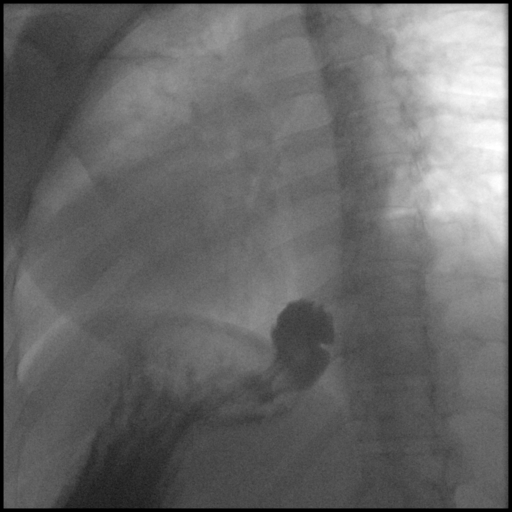

[Series 6: cp_standard · 0.36mm/px · 2 of 90 frames shown (4 of 10)]
[frame 46/90]
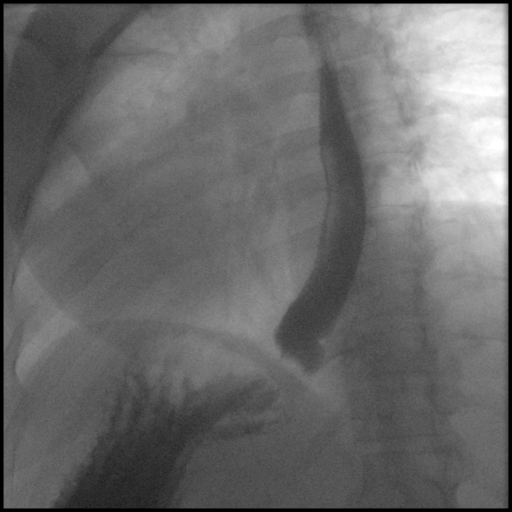
[frame 77/90]
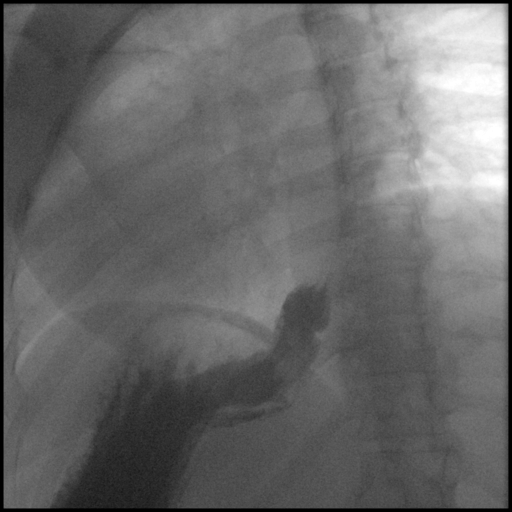

[Series 7: cp_standard · 0.36mm/px · 1 of 148 frames shown (5 of 10)]
[frame 75/148]
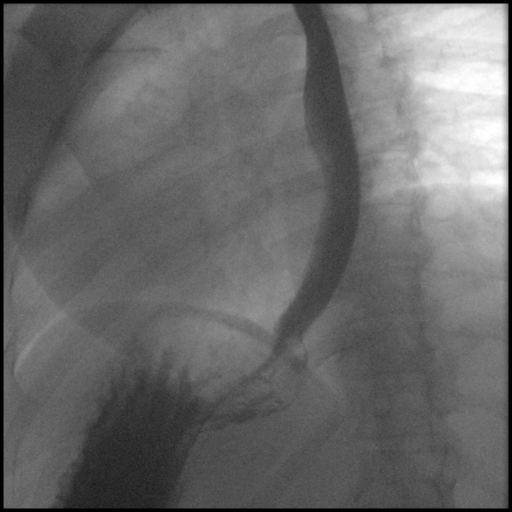

[Series 9: cp_standard · 0.18mm/px · 1 of 1 slices shown (6 of 10)]
[im 1/1]
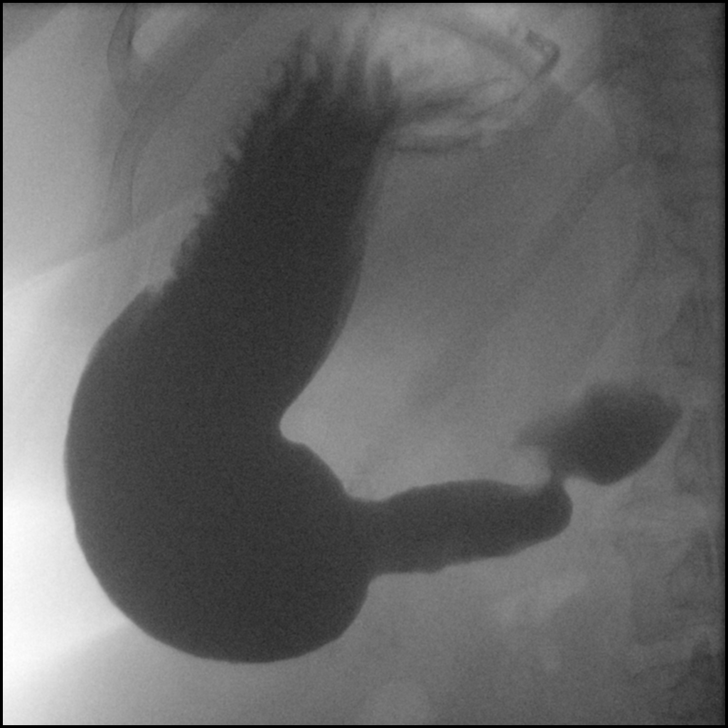

[Series 12: cp_standard · 0.17mm/px · 1 of 1 slices shown (7 of 10)]
[im 1/1]
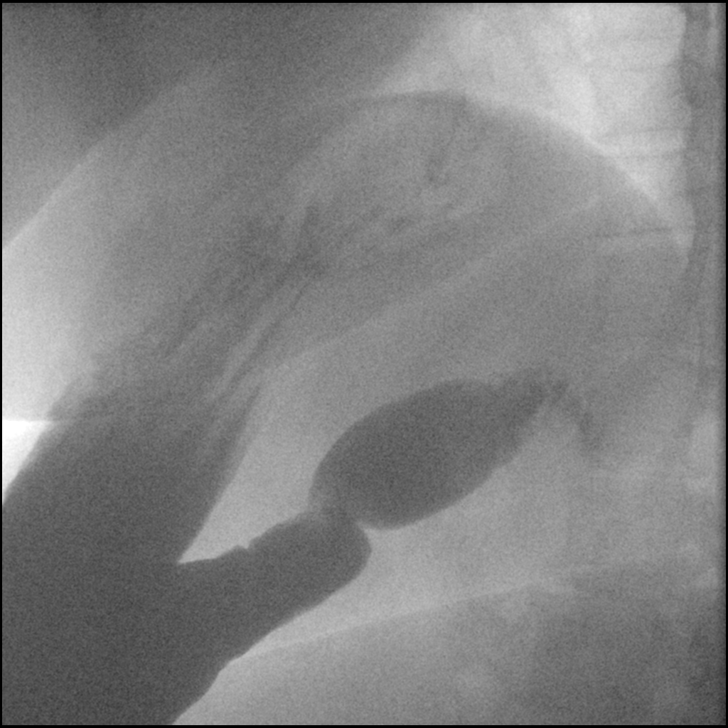

[Series 13: cp_standard · 0.17mm/px · 1 of 1 slices shown (8 of 10)]
[im 1/1]
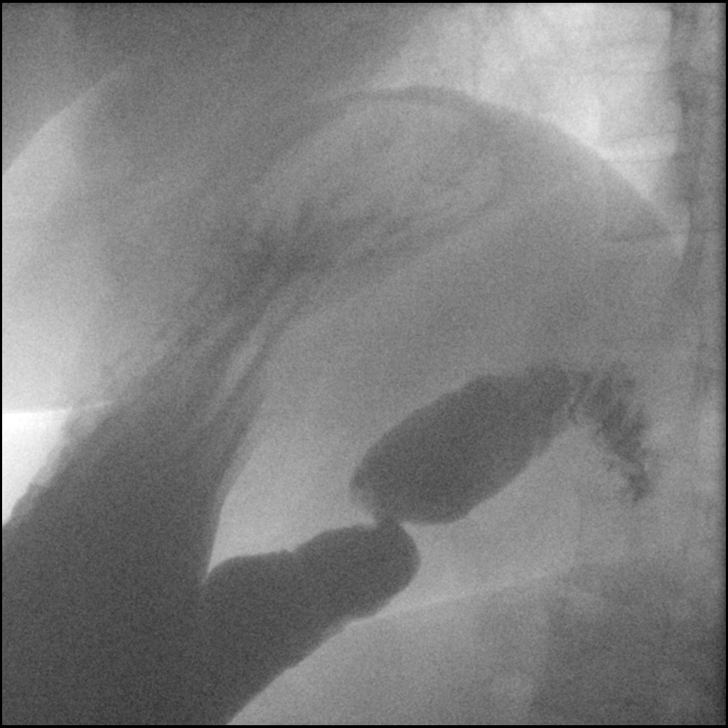

[Series 16: cp_standard · 0.18mm/px · 1 of 1 slices shown (9 of 10)]
[im 1/1]
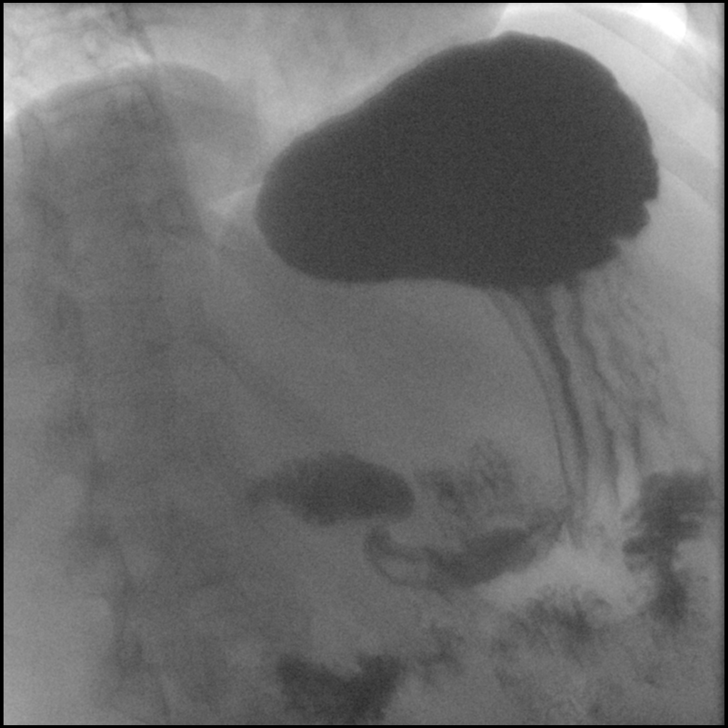

[Series 19: cp_standard · 0.18mm/px · 1 of 1 slices shown (10 of 10)]
[im 1/1]
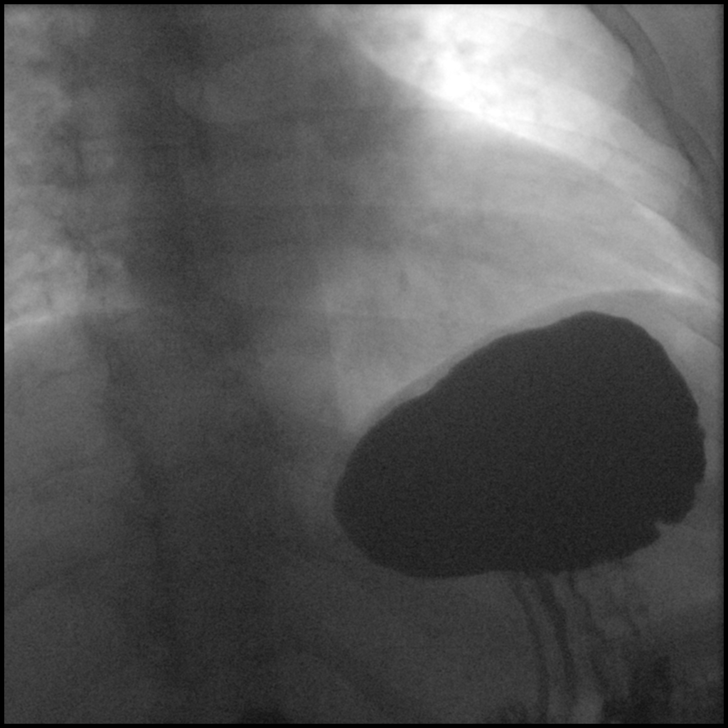

[14 of 24 positions shown; findings below may reference images not displayed]

FINDINGS: Scout image demonstrates normal bowel gas pattern. No organomegaly
or suspicious calcification.

Fluoroscopic evaluation of swallowing demonstrates normal esophageal
motility. No fixed stricture, fold thickening or mass. No reflux
with the water siphon maneuver.

Small hiatal hernia. Otherwise, stomach, duodenal bulb and duodenal
sweep are normal. No ulceration, fold thickening or mass.
IMPRESSION: Small hiatal hernia.  Otherwise unremarkable study.

## 2020-07-08 ENCOUNTER — Encounter (HOSPITAL_COMMUNITY): Payer: Self-pay

## 2021-07-17 ENCOUNTER — Encounter (HOSPITAL_COMMUNITY): Payer: Self-pay | Admitting: *Deleted
